# Patient Record
Sex: Female | Born: 1990 | Race: White | Hispanic: No | Marital: Single | Smoking: Never smoker
Health system: Southern US, Community
[De-identification: ages and names within clinical notes are randomized; demographics above are authoritative.]

## PROBLEM LIST (undated history)

## (undated) DIAGNOSIS — Z8639 Personal history of other endocrine, nutritional and metabolic disease: Secondary | ICD-10-CM

## (undated) HISTORY — PX: HAND SURGERY: SHX662

## (undated) HISTORY — DX: Personal history of other endocrine, nutritional and metabolic disease: Z86.39

---

## 2014-06-11 HISTORY — PX: SEPTOPLASTY: SHX2393

## 2015-08-05 MED FILL — DROSPIR-ETH ESTRA 3/.02 MG: 3-0.02 | 28 days supply | Qty: 28 | Fill #0

## 2015-08-11 ENCOUNTER — Telehealth: Payer: Self-pay | Admitting: Behavioral Health

## 2015-08-11 NOTE — Telephone Encounter (Signed)
Attempted to reach patient for Pre-Visit Call. Unable to leave a message at this time; the patient's voice mailbox is full.

## 2015-08-12 ENCOUNTER — Ambulatory Visit (INDEPENDENT_AMBULATORY_CARE_PROVIDER_SITE_OTHER): Payer: 59 | Admitting: Family

## 2015-08-12 ENCOUNTER — Encounter: Payer: Self-pay | Admitting: Family

## 2015-08-12 ENCOUNTER — Other Ambulatory Visit (HOSPITAL_COMMUNITY)
Admission: RE | Admit: 2015-08-12 | Discharge: 2015-08-12 | Disposition: A | Discharge status: Home / Self Care | Payer: 59 | Source: Ambulatory Visit | Attending: Family | Admitting: Family

## 2015-08-12 VITALS — BP 136/85 | HR 96 | Temp 97.9°F | Resp 16 | Ht 68.0 in | Wt 125.4 lb

## 2015-08-12 DIAGNOSIS — G4700 Insomnia, unspecified: Secondary | ICD-10-CM | POA: Diagnosis not present

## 2015-08-12 DIAGNOSIS — Z0000 Encounter for general adult medical examination without abnormal findings: Secondary | ICD-10-CM | POA: Diagnosis not present

## 2015-08-12 DIAGNOSIS — Z01419 Encounter for gynecological examination (general) (routine) without abnormal findings: Secondary | ICD-10-CM | POA: Diagnosis not present

## 2015-08-12 LAB — URINALYSIS, ROUTINE W REFLEX MICROSCOPIC
BILIRUBIN URINE: NEGATIVE
HGB URINE DIPSTICK: NEGATIVE
Ketones, ur: NEGATIVE
LEUKOCYTES UA: NEGATIVE
NITRITE: NEGATIVE
Specific Gravity, Urine: 1.02 (ref 1.000–1.030)
TOTAL PROTEIN, URINE-UPE24: NEGATIVE
URINE GLUCOSE: NEGATIVE
Urobilinogen, UA: 0.2 (ref 0.0–1.0)
pH: 6.5 (ref 5.0–8.0)

## 2015-08-12 LAB — HEPATIC FUNCTION PANEL
ALK PHOS: 35 U/L (ref 33–115)
ALT: 13 U/L (ref 6–29)
AST: 17 U/L (ref 10–30)
Albumin: 4.6 g/dL (ref 3.6–5.1)
BILIRUBIN INDIRECT: 0.5 mg/dL (ref 0.2–1.2)
Bilirubin, Direct: 0.1 mg/dL (ref <=0.2)
TOTAL PROTEIN: 7.4 g/dL (ref 6.1–8.1)
Total Bilirubin: 0.6 mg/dL (ref 0.2–1.2)

## 2015-08-12 LAB — BASIC METABOLIC PANEL
BUN: 12 mg/dL (ref 7–25)
CHLORIDE: 100 mmol/L (ref 98–110)
CO2: 27 mmol/L (ref 20–31)
Calcium: 9.7 mg/dL (ref 8.6–10.2)
Creat: 0.73 mg/dL (ref 0.50–1.10)
GLUCOSE: 84 mg/dL (ref 65–99)
POTASSIUM: 4.2 mmol/L (ref 3.5–5.3)
Sodium: 137 mmol/L (ref 135–146)

## 2015-08-12 LAB — TSH: TSH: 2.73 m[IU]/L

## 2015-08-12 LAB — LIPID PANEL
CHOL/HDL RATIO: 2.5 ratio (ref <=5.0)
CHOLESTEROL: 155 mg/dL (ref 125–200)
HDL: 63 mg/dL (ref >=46)
LDL Cholesterol: 74 mg/dL (ref <130)
TRIGLYCERIDES: 89 mg/dL (ref <150)
VLDL: 18 mg/dL (ref <30)

## 2015-08-12 LAB — HIV ANTIBODY (ROUTINE TESTING W REFLEX): HIV 1&2 Ab, 4th Generation: NONREACTIVE

## 2015-08-12 MED ORDER — ZOLPIDEM TARTRATE 5 MG PO TABS: 5.0000 mg | ORAL_TABLET | Freq: Every evening | ORAL | Status: DC | PRN

## 2015-08-12 MED ORDER — DROSPIRENONE-ETHINYL ESTRADIOL 3-0.02 MG PO TABS: 1.0000 | ORAL_TABLET | Freq: Every day | ORAL | Status: DC

## 2015-08-12 NOTE — Patient Instructions (Signed)
Please complete lab work prior to leaving. Keep up the great work with healthy diet and exercise. Welcome to [facility]!
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
## 2015-08-12 NOTE — Assessment & Plan Note (Signed)
Stable with prn use of ambien.  A controlled substance contract is signed today and will send for UDS.

## 2015-08-12 NOTE — Addendum Note (Signed)
Addended by: Mervin KungFERGERSON, Hailea Eaglin A on: 08/12/2015 10:21 AM   Modules accepted: Orders

## 2015-08-12 NOTE — Assessment & Plan Note (Signed)
Immunizations reviewed and up to date. Continue healthy diet, exercise, obtain routine lab work. Pap performed.

## 2015-08-12 NOTE — Addendum Note (Signed)
Addended by: Eustace QuailEABOLD, Tameka Hoiland J on: 08/12/2015 10:10 AM   Modules accepted: Orders

## 2015-08-12 NOTE — Progress Notes (Signed)
Pre visit review using our clinic review tool, if applicable. No additional management support is needed unless otherwise documented below in the visit note. 

## 2015-08-15 ENCOUNTER — Encounter: Payer: Self-pay | Admitting: Family

## 2015-08-16 LAB — CYTOLOGY - PAP

## 2015-08-17 NOTE — Progress Notes (Signed)
Ok

## 2015-09-05 MED FILL — ZOLPIDEM TARTRATE 5 MG TAB: 5 | 30 days supply | Qty: 30 | Fill #0

## 2015-09-05 MED FILL — DROSPIR-ETH ESTRA 3/.02 MG: 3-0.02 | 28 days supply | Qty: 28 | Fill #0

## 2015-09-26 ENCOUNTER — Emergency Department (HOSPITAL_COMMUNITY): Payer: 59

## 2015-09-26 ENCOUNTER — Emergency Department (HOSPITAL_COMMUNITY)
Admission: EM | Admit: 2015-09-26 | Discharge: 2015-09-26 | Disposition: A | Discharge status: Home / Self Care | Payer: 59 | Attending: Emergency Medicine | Admitting: Emergency Medicine

## 2015-09-26 ENCOUNTER — Encounter (HOSPITAL_COMMUNITY): Payer: Self-pay | Admitting: Emergency Medicine

## 2015-09-26 DIAGNOSIS — R1031 Right lower quadrant pain: Secondary | ICD-10-CM | POA: Diagnosis not present

## 2015-09-26 DIAGNOSIS — Z3202 Encounter for pregnancy test, result negative: Secondary | ICD-10-CM | POA: Diagnosis not present

## 2015-09-26 DIAGNOSIS — Z793 Long term (current) use of hormonal contraceptives: Secondary | ICD-10-CM | POA: Insufficient documentation

## 2015-09-26 DIAGNOSIS — R000 Tachycardia, unspecified: Secondary | ICD-10-CM | POA: Insufficient documentation

## 2015-09-26 DIAGNOSIS — D72829 Elevated white blood cell count, unspecified: Secondary | ICD-10-CM | POA: Insufficient documentation

## 2015-09-26 DIAGNOSIS — Z8639 Personal history of other endocrine, nutritional and metabolic disease: Secondary | ICD-10-CM | POA: Insufficient documentation

## 2015-09-26 DIAGNOSIS — R110 Nausea: Secondary | ICD-10-CM | POA: Diagnosis not present

## 2015-09-26 DIAGNOSIS — R509 Fever, unspecified: Secondary | ICD-10-CM | POA: Insufficient documentation

## 2015-09-26 DIAGNOSIS — R102 Pelvic and perineal pain: Secondary | ICD-10-CM

## 2015-09-26 LAB — COMPREHENSIVE METABOLIC PANEL
ALK PHOS: 39 U/L (ref 38–126)
ALT: 13 U/L — ABNORMAL LOW (ref 14–54)
ANION GAP: 10 (ref 5–15)
AST: 16 U/L (ref 15–41)
Albumin: 4.1 g/dL (ref 3.5–5.0)
BILIRUBIN TOTAL: 1.2 mg/dL (ref 0.3–1.2)
BUN: 10 mg/dL (ref 6–20)
CALCIUM: 9.4 mg/dL (ref 8.9–10.3)
CO2: 24 mmol/L (ref 22–32)
Chloride: 102 mmol/L (ref 101–111)
Creatinine, Ser: 0.74 mg/dL (ref 0.44–1.00)
GFR calc non Af Amer: >60 mL/min (ref >60)
Glucose, Bld: 118 mg/dL — ABNORMAL HIGH (ref 65–99)
Potassium: 3.6 mmol/L (ref 3.5–5.1)
SODIUM: 136 mmol/L (ref 135–145)
TOTAL PROTEIN: 7.4 g/dL (ref 6.5–8.1)

## 2015-09-26 LAB — URINALYSIS, ROUTINE W REFLEX MICROSCOPIC
BILIRUBIN URINE: NEGATIVE
Glucose, UA: NEGATIVE mg/dL
HGB URINE DIPSTICK: NEGATIVE
KETONES UR: 15 mg/dL — AB
Leukocytes, UA: NEGATIVE
Nitrite: NEGATIVE
PROTEIN: NEGATIVE mg/dL
Specific Gravity, Urine: 1.022 (ref 1.005–1.030)
pH: 6 (ref 5.0–8.0)

## 2015-09-26 LAB — CBC WITH DIFFERENTIAL/PLATELET
BASOS ABS: 0 10*3/uL (ref 0.0–0.1)
Basophils Relative: 0 %
Eosinophils Absolute: 0 10*3/uL (ref 0.0–0.7)
Eosinophils Relative: 0 %
HEMATOCRIT: 40.8 % (ref 36.0–46.0)
Hemoglobin: 13.8 g/dL (ref 12.0–15.0)
LYMPHS PCT: 13 %
Lymphs Abs: 1.9 10*3/uL (ref 0.7–4.0)
MCH: 29.9 pg (ref 26.0–34.0)
MCHC: 33.8 g/dL (ref 30.0–36.0)
MCV: 88.5 fL (ref 78.0–100.0)
MONO ABS: 0.9 10*3/uL (ref 0.1–1.0)
MONOS PCT: 7 %
NEUTROS ABS: 11.3 10*3/uL — AB (ref 1.7–7.7)
Neutrophils Relative %: 80 %
Platelets: 312 10*3/uL (ref 150–400)
RBC: 4.61 MIL/uL (ref 3.87–5.11)
RDW: 13 % (ref 11.5–15.5)
WBC: 14.2 10*3/uL — ABNORMAL HIGH (ref 4.0–10.5)

## 2015-09-26 LAB — I-STAT BETA HCG BLOOD, ED (MC, WL, AP ONLY)

## 2015-09-26 MED ORDER — HYDROCODONE-ACETAMINOPHEN 5-325 MG PO TABS: 1.0000 | ORAL_TABLET | Freq: Four times a day (QID) | ORAL | Status: DC | PRN

## 2015-09-26 MED ORDER — SODIUM CHLORIDE 0.9 % IV BOLUS (SEPSIS)
1000.0000 mL | Freq: Once | INTRAVENOUS | Status: AC
  Administered 2015-09-26: 1000 mL via INTRAVENOUS

## 2015-09-26 MED ORDER — ONDANSETRON 4 MG PO TBDP: 4.0000 mg | ORAL_TABLET | Freq: Three times a day (TID) | ORAL | Status: DC | PRN

## 2015-09-26 MED ORDER — MORPHINE SULFATE (PF) 2 MG/ML IV SOLN
2.0000 mg | Freq: Once | INTRAVENOUS | Status: AC
  Administered 2015-09-26: 2 mg via INTRAVENOUS
  Filled 2015-09-26: qty 1

## 2015-09-26 MED ORDER — ACETAMINOPHEN 500 MG PO TABS
1000.0000 mg | ORAL_TABLET | Freq: Once | ORAL | Status: AC
  Administered 2015-09-26: 1000 mg via ORAL
  Filled 2015-09-26: qty 2

## 2015-09-26 MED ORDER — ONDANSETRON HCL 4 MG/2ML IJ SOLN
4.0000 mg | Freq: Once | INTRAMUSCULAR | Status: AC
  Administered 2015-09-26: 4 mg via INTRAVENOUS
  Filled 2015-09-26: qty 2

## 2015-09-26 MED ORDER — IOPAMIDOL (ISOVUE-300) INJECTION 61%
INTRAVENOUS | Status: AC
  Administered 2015-09-26: 100 mL
  Filled 2015-09-26: qty 100

## 2015-09-26 NOTE — ED Notes (Signed)
Pt ambulates independently and with steady gait at time of discharge. Discharge instructions and follow up information reviewed with patient. No other questions or concerns voiced at this time.  

## 2015-09-26 NOTE — ED Provider Notes (Signed)
CSN: [id number]     Arrival date & time 09/26/15  1315 History  By signing my name below, I, Ruth Stone, attest that this documentation has been prepared under the direction and in the presence of Kristie CowmanHeather Garv Kuechle PA-C,   Electronically Signed: [name], ED Scribe 09/26/2015 at 6:20 PM.  Chief Complaint  Patient presents with  . Abdominal Pain   The history is provided by the patient. No language interpreter was used.   HPI Comments: De HollingsheadCatherine L Stone is a 25 y.o. female who presents to the Emergency Department complaining of gradual onset, worsening, RLQ pain, ongoing for two days. Symptoms began as generalized abdominal pain before localizing to the RLQ. Pt reports associated fever with tmax of 101 degrees, and nausea. No other associated symptoms noted. Zofran used at home with relief to nausea. Pt took tylenol with some relief to fever and abdominal pain. No other worsening or alleviating factors noted. Pt denies diarrhea, constipation, vomiting, vaginal discharge, vaginal bleeding, or any other pertinent symptoms. LMP was 09/04/2015.   Past Medical History  Diagnosis Date  . History of hypothyroidism    Past Surgical History  Procedure Laterality Date  . Hand surgery Right 2010-2011    tendon transfers and skin grafts  . Septoplasty  2016    sinus ballooning and turbinate reduction   Family History  Problem Relation Age of Onset  . Cancer Mother     history of breast and skin cancer  . Hyperlipidemia Father   . Hypertension Father   . Heart attack Maternal Grandfather   . Stroke Paternal Grandfather    Social History  Substance Use Topics  . Smoking status: Never Smoker   . Smokeless tobacco: Never Used  . Alcohol Use: 2.4 - 3.0 oz/week    4-5 Standard drinks or equivalent per week   OB History    No data available     Review of Systems A complete 10 system review of systems was obtained and all systems are negative except as noted in the HPI and PMH.     Allergies  Review of patient's allergies indicates no known allergies.  Home Medications   Prior to Admission medications   Medication Sig Start Date End Date Taking? Authorizing Provider  drospirenone-ethinyl estradiol (YAZ,GIANVI,LORYNA) 3-0.02 MG tablet Take 1 tablet by mouth daily. 08/12/15   Sandford CrazeMelissa O'Sullivan, NP  zolpidem (AMBIEN) 5 MG tablet Take 1 tablet (5 mg total) by mouth at bedtime as needed for sleep. 08/12/15   Sandford CrazeMelissa O'Sullivan, NP   BP 127/86 mmHg  Pulse 119  Temp(Src) 98.8 F (37.1 C) (Oral)  Resp 20  Ht 5\' 7"  (1.702 m)  Wt 125 lb (56.7 kg)  BMI 19.57 kg/m2  SpO2 100% Physical Exam  Constitutional: She appears well-developed and well-nourished. No distress.  HENT:  Head: Normocephalic and atraumatic.  Mouth/Throat: Oropharynx is clear and moist.  Eyes: Conjunctivae and EOM are normal.  Neck: Normal range of motion. Neck supple. No tracheal deviation present.  Cardiovascular: Regular rhythm.  Tachycardia present.  Exam reveals no gallop.   No murmur heard. Pulmonary/Chest: Effort normal and breath sounds normal. No respiratory distress.  Abdominal: Soft. Bowel sounds are normal. She exhibits no distension and no mass. There is tenderness. There is tenderness at McBurney's point. There is no rebound and no guarding.  RLQ TTP. Positive Rovsing's sign.   Genitourinary: Vagina normal and uterus normal. Cervix exhibits no motion tenderness and no discharge. Right adnexum displays no mass, no tenderness and no  fullness. Left adnexum displays no mass, no tenderness and no fullness. No erythema or tenderness in the vagina. No vaginal discharge found.  Musculoskeletal: Normal range of motion.  Neurological: She is alert.  Skin: Skin is warm and dry.  Psychiatric: She has a normal mood and affect. Her behavior is normal.  Nursing note and vitals reviewed.  Chaperone present for pelvic exam.   ED Course  Procedures (including critical care time) DIAGNOSTIC  STUDIES: Oxygen Saturation is 100% on RA, normal by my interpretation.    COORDINATION OF CARE: 1:55 PM-Discussed treatment plan which includes pelvic exam, US pelvic complete, CT abdomen pelvis with contrast, CBC with differential/platelet, urinalysis, and CMP with pt at bedside and pt agreed to plan.   Labs Review Labs Reviewed  CBC WITH DIFFERENTIAL/PLATELET - Abnormal; Notable for the following:    WBC 14.2 (*)    Neutro Abs 11.3 (*)    All other components within normal limits  COMPREHENSIVE METABOLIC PANEL - Abnormal; Notable for the following:    Glucose, Bld 118 (*)    ALT 13 (*)    All other components within normal limits  URINALYSIS, ROUTINE W REFLEX MICROSCOPIC (NOT AT [facility]) - Abnormal; Notable for the following:    Ketones, ur 15 (*)    All other components within normal limits  I-STAT BETA HCG BLOOD, ED ([facility], [facility], [facility] ONLY)    Imaging Review US Transvaginal Non-ob  09/26/2015  CLINICAL DATA:  Right lower quadrant abdominal pain for 2 days. EXAM: TRANSABDOMINAL AND TRANSVAGINAL ULTRASOUND OF PELVIS TECHNIQUE: Both transabdominal and transvaginal ultrasound examinations of the pelvis were performed. Transabdominal technique was performed for global imaging of the pelvis including uterus, ovaries, adnexal regions, and pelvic cul-de-sac. It was necessary to proceed with endovaginal exam following the transabdominal exam to visualize the endometrium and ovaries. COMPARISON:  09/20/2015 FINDINGS: Uterus Measurements: 6.8 by 2.7 by 3.9 cm. No fibroids or other mass visualized. Endometrium Thickness: 1 mm.  No focal abnormality visualized. Right ovary Measurements: 1.9 by 1.7 by 2.8 cm (volume 4.7 cc). Normal appearance/no adnexal mass. Left ovary Measurements: 1.9 by 0.8 by 2.2 cm (volume 1.7 cc). Normal appearance/no adnexal mass. Other findings Trace free pelvic fluid, image 88, likely physiologic. IMPRESSION: 1. No acute abnormality involving the uterus or ovaries is identified. 2.  Smaller than average ovaries for age. Electronically Signed   By: Gaylyn Rong M.D.   On: 09/26/2015 18:13   US Pelvis Complete  09/26/2015  CLINICAL DATA:  Right lower quadrant abdominal pain for 2 days. EXAM: TRANSABDOMINAL AND TRANSVAGINAL ULTRASOUND OF PELVIS TECHNIQUE: Both transabdominal and transvaginal ultrasound examinations of the pelvis were performed. Transabdominal technique was performed for global imaging of the pelvis including uterus, ovaries, adnexal regions, and pelvic cul-de-sac. It was necessary to proceed with endovaginal exam following the transabdominal exam to visualize the endometrium and ovaries. COMPARISON:  09/20/2015 FINDINGS: Uterus Measurements: 6.8 by 2.7 by 3.9 cm. No fibroids or other mass visualized. Endometrium Thickness: 1 mm.  No focal abnormality visualized. Right ovary Measurements: 1.9 by 1.7 by 2.8 cm (volume 4.7 cc). Normal appearance/no adnexal mass. Left ovary Measurements: 1.9 by 0.8 by 2.2 cm (volume 1.7 cc). Normal appearance/no adnexal mass. Other findings Trace free pelvic fluid, image 88, likely physiologic. IMPRESSION: 1. No acute abnormality involving the uterus or ovaries is identified. 2. Smaller than average ovaries for age. Electronically Signed   By: Gaylyn Rong M.D.   On: 09/26/2015 18:13   Ct Abdomen Pelvis W Contrast  09/26/2015  CLINICAL DATA:  Right lower quadrant pain and nausea for 2 days. Initial encounter. EXAM: CT ABDOMEN AND PELVIS WITH CONTRAST TECHNIQUE: Multidetector CT imaging of the abdomen and pelvis was performed using the standard protocol following bolus administration of intravenous contrast. CONTRAST:  100 ml ISOVUE-300 IOPAMIDOL (ISOVUE-300) INJECTION 61% COMPARISON:  None. FINDINGS: The lung bases are clear.  No pleural or pericardial effusion. The gallbladder, liver, spleen, adrenal glands, pancreas and kidneys appear normal. Trace amount of free pelvic fluid is consistent with physiologic change. Uterus, adnexa  and urinary bladder appear normal. The stomach, small and large bowel and appendix are unremarkable. There is no lymphadenopathy. No focal bony abnormality is identified. IMPRESSION: Negative for appendicitis.  Negative CT abdomen and pelvis. Electronically Signed   By: Drusilla Kanner M.D.   On: 09/26/2015 16:21   I have personally reviewed and evaluated these images and lab results as part of my medical decision-making.   EKG Interpretation None      MDM   Final diagnoses:  RLQ abdominal pain  Patient presents today with RLQ abdominal pain that has been present for the past couple of days.  Labs unremarkable aside from mild leukocytosis.  UA negative.  Pregnancy test also negative.  CT ab/pelvis is also negative.  Pelvic ultrasound is negative.  Feel that the patient is stable for discharge.  Return precautions given.   I personally performed the services described in this documentation, which was scribed in my presence. The recorded information has been reviewed and is accurate.    Santiago Glad, PA-C 09/27/15 0015  Doug Sou, MD 09/28/15 [phone]

## 2015-09-26 NOTE — ED Notes (Signed)
Pt here with RLQ pain and fever since Saturday. Pt endorses nausea relieved by zofran. HR 120

## 2015-10-03 MED FILL — DROSPIR-ETH ESTRA 3/.02 MG: 3-0.02 | 84 days supply | Qty: 84 | Fill #1

## 2015-12-19 MED FILL — DROSPIR-ETH ESTRA 3/.02 MG: 3-0.02 | 56 days supply | Qty: 56 | Fill #1

## 2016-01-13 ENCOUNTER — Ambulatory Visit (INDEPENDENT_AMBULATORY_CARE_PROVIDER_SITE_OTHER): Payer: 59 | Admitting: Family Medicine

## 2016-01-13 ENCOUNTER — Encounter: Payer: Self-pay | Admitting: Family Medicine

## 2016-01-13 VITALS — BP 134/89 | HR 87 | Temp 98.9°F | Resp 20 | Wt 118.5 lb

## 2016-01-13 DIAGNOSIS — J029 Acute pharyngitis, unspecified: Secondary | ICD-10-CM

## 2016-01-13 LAB — POCT RAPID STREP A (OFFICE): Rapid Strep A Screen: NEGATIVE

## 2016-01-13 MED ORDER — AMOXICILLIN-POT CLAVULANATE 875-125 MG PO TABS: 1.0000 | ORAL_TABLET | Freq: Two times a day (BID) | ORAL | 0 refills | Status: DC

## 2016-01-13 NOTE — Patient Instructions (Signed)
Your strep is negative today. It could be early in the course of illness. If symptoms worsen over the weekend have printed script filled. Until then start Flonase, rest and hydrate.

## 2016-01-13 NOTE — Progress Notes (Signed)
AUGUSTINE BRANNICK , [birth year], 25 y.o., female MRN: [id number] Patient Care Team    Relationship Specialty Notifications Start End  Sandford Craze, NP PCP - General Internal Medicine  08/12/15     CC: sore throat Subjective: Pt presents for an acute OV with complaints of sore throat of 2 days duration.  Associated symptoms include ear pain, swollen glands and fever. She reports it hurts to swallow. She has taken tylenol and ibf for fever. Her niece and nephew was diagnosed with strep this weekend and she was around them. No lung disease history.     No Known Allergies Social History  Substance Use Topics  . Smoking status: Never Smoker  . Smokeless tobacco: Never Used  . Alcohol use 2.4 - 3.0 oz/week    4 - 5 Standard drinks or equivalent per week   Past Medical History:  Diagnosis Date  . History of hypothyroidism    Past Surgical History:  Procedure Laterality Date  . HAND SURGERY Right 2010-2011   tendon transfers and skin grafts  . SEPTOPLASTY  2016   sinus ballooning and turbinate reduction   Family History  Problem Relation Age of Onset  . Cancer Mother     history of breast and skin cancer  . Hyperlipidemia Father   . Hypertension Father   . Heart attack Maternal Grandfather   . Stroke Paternal Grandfather      Medication List       Accurate as of 01/13/16  2:43 PM. Always use your most recent med list.          drospirenone-ethinyl estradiol 3-0.02 MG tablet Commonly known as:  YAZ,GIANVI,LORYNA Take 1 tablet by mouth daily.   zolpidem 5 MG tablet Commonly known as:  AMBIEN Take 1 tablet (5 mg total) by mouth at bedtime as needed for sleep.       No results found for this or any previous visit (from the past 24 hour(s)). No results found.   ROS: Negative, with the exception of above mentioned in HPI   Objective:  BP 134/89 (BP Location: Right Arm, Patient Position: Sitting, Cuff Size: Normal)   Pulse 87   Temp 98.9 F (37.2 C)    Resp 20   Wt 118 lb 8 oz (53.8 kg)   LMP 12/24/2015 (Approximate)   SpO2 99%   BMI 18.56 kg/m  Body mass index is 18.56 kg/m. Gen: Afebrile. No acute distress. Nontoxic in appearance, well developed, well nourished.  HENT: AT. [place]. Bilateral TM visualized with bilateral fullness, no erythema. MMM, no oral lesions. Bilateral nares mild erythema,. Throat with erythema, no exudates. No cough present on exam. Mild hoarseness present on exam. No tenderness to facial sinuses. Eyes:Pupils Equal Round Reactive to light, Extraocular movements intact,  Conjunctiva without redness, discharge or icterus. Neck/lymp/endocrine: Supple, no lymphadenopathy CV: RRR  Chest: CTAB, no wheeze or crackles.  Abd: Soft.. NTND. BS present.  Skin: No rashes, purpura or petechiae.  Neuro: Normal gait. PERLA. EOMi. Alert. Oriented x3   Assessment/Plan: TEDDI BADALAMENTI is a 25 y.o. female present for acute OV for  1. Sore throat - POCT rapid strep A: Negative  2. Acute pharyngitis, unspecified etiology - Discussed with patient she does have some swollen glands, this is either an early strep pharyngitis or a viral pharyngitis. Considering her niece and nephew tested positive for strep, provided patient if he Augmentin printed prescription. She is to treat her symptoms for the next 2 days as file with supportive  therapy, if symptoms are worsening by Sunday she is to have Augmentin prescription filled and start. - Follow-up in 10 days if symptoms are not improving or resolved.  > 25 minutes spent with patient, >50% of time spent face to face counseling patient and coordinating care.   electronically signed by:  Felix Pacini, DO  [facility] - [place]

## 2016-01-24 ENCOUNTER — Encounter: Payer: Self-pay | Admitting: Family

## 2016-01-25 MED ORDER — ZOLPIDEM TARTRATE 5 MG PO TABS: 5.0000 mg | ORAL_TABLET | Freq: Every evening | ORAL | 0 refills | Status: DC | PRN

## 2016-01-25 NOTE — Telephone Encounter (Signed)
Last zolpidem Rx:  08/12/15 No previous UDS or CSC Last OV: 08/12/15 Next OV: none scheduled.   Rx printed and forwarded to PCP for signature. Please advise when pt should follow up in the office?

## 2016-01-25 NOTE — Telephone Encounter (Signed)
1 yr follow up for cpx please.

## 2016-02-07 MED ORDER — ZOLPIDEM TARTRATE 5 MG PO TABS: 5.0000 mg | ORAL_TABLET | Freq: Every evening | ORAL | 0 refills | Status: DC | PRN

## 2016-02-07 MED FILL — GIANVI 3-0.02 MG TABS: 3-0.02 | 28 days supply | Qty: 28 | Fill #2

## 2016-02-07 NOTE — Telephone Encounter (Signed)
Rx cancelled at [facility] and called to Darl Pikes[facility]an [facility]. Mychart message sent to pt.

## 2016-02-07 NOTE — Telephone Encounter (Signed)
Pt called in because her Rx for Ambien was sent to the incorrect pharmacy. Pt would like to have Rx sent to the [facility] instead.

## 2016-02-08 MED FILL — ZOLPIDEM TARTRATE 5 MG TAB: 5 | 30 days supply | Qty: 30 | Fill #0

## 2016-03-08 MED FILL — FLUCONAZOLE 150 MG TABLET: 150 | 2 days supply | Qty: 2 | Fill #0

## 2016-03-13 ENCOUNTER — Other Ambulatory Visit: Payer: Self-pay | Admitting: Emergency Medicine

## 2016-03-13 MED ORDER — DROSPIRENONE-ETHINYL ESTRADIOL 3-0.02 MG PO TABS: 1.0000 | ORAL_TABLET | Freq: Every day | ORAL | 3 refills | Status: DC

## 2016-03-13 MED FILL — GIANVI 3-0.02 MG TABS: 3-0.02 | 84 days supply | Qty: 84 | Fill #0

## 2016-03-26 DIAGNOSIS — H5213 Myopia, bilateral: Secondary | ICD-10-CM | POA: Diagnosis not present

## 2016-06-01 ENCOUNTER — Encounter: Payer: Self-pay | Admitting: Family

## 2016-06-01 MED ORDER — ZOLPIDEM TARTRATE 5 MG PO TABS: 5.0000 mg | ORAL_TABLET | Freq: Every evening | ORAL | 0 refills | Status: DC | PRN

## 2016-06-01 MED FILL — ZOLPIDEM TARTRATE 5 MG TAB: 5 | 30 days supply | Qty: 30 | Fill #0

## 2016-06-01 MED FILL — GIANVI 3-0.02 MG TABS: 3-0.02 | 28 days supply | Qty: 28 | Fill #1

## 2016-06-01 NOTE — Telephone Encounter (Signed)
Rx faxed, message sent to pt.

## 2016-06-01 NOTE — Telephone Encounter (Signed)
Last OV: 08/12/15 Next OV:  None scheduled UDS: Contract signed but no UDS given at 08/12/15 visit.  Will need pt to pick up rx and given UDS.   Rx printed and forwarded to PCP for signature.

## 2016-07-12 ENCOUNTER — Other Ambulatory Visit: Payer: Self-pay | Admitting: Family

## 2016-07-13 ENCOUNTER — Telehealth: Payer: Self-pay | Admitting: Family

## 2016-07-13 MED FILL — GIANVI 3-0.02 MG TABS: 3-0.02 | 28 days supply | Qty: 28 | Fill #0

## 2016-07-13 NOTE — Telephone Encounter (Signed)
Patient is requesting a refill of drospirenone-ethinyl estradiol (YAZ,GIANVI,LORYNA) 3-0.02 MG tablet Please advise  Pharmacy: [facility] [place] [address]

## 2016-07-13 NOTE — Telephone Encounter (Signed)
Rx already sent.

## 2016-08-06 ENCOUNTER — Encounter: Payer: 59 | Admitting: Family

## 2016-08-07 ENCOUNTER — Other Ambulatory Visit: Payer: Self-pay | Admitting: Family

## 2016-08-07 MED FILL — TIVICAY 50 MG TABLET: 50 | 28 days supply | Qty: 28 | Fill #0

## 2016-08-07 MED FILL — DESCOVY 200-25 MG TABS: 200-25 | 28 days supply | Qty: 28 | Fill #0

## 2016-08-08 ENCOUNTER — Other Ambulatory Visit: Payer: Self-pay | Admitting: Family Medicine

## 2016-08-08 MED ORDER — ONDANSETRON HCL 4 MG PO TABS: 4.0000 mg | ORAL_TABLET | Freq: Three times a day (TID) | ORAL | 1 refills | Status: DC | PRN

## 2016-08-08 MED ORDER — DIAZEPAM 5 MG PO TABS: 5.0000 mg | ORAL_TABLET | Freq: Three times a day (TID) | ORAL | 1 refills | Status: DC | PRN

## 2016-08-08 MED FILL — GIANVI 3-0.02 MG TABS: 3-0.02 | 28 days supply | Qty: 28 | Fill #0

## 2016-08-08 MED FILL — ONDANSETRON ODT 4 MG TABLET: 4 | 10 days supply | Qty: 30 | Fill #0

## 2016-08-08 MED FILL — diazePAM 5 MG TABS: 5 | 10 days supply | Qty: 30 | Fill #0

## 2016-08-21 ENCOUNTER — Encounter: Payer: Self-pay | Admitting: Family

## 2016-08-21 ENCOUNTER — Ambulatory Visit (INDEPENDENT_AMBULATORY_CARE_PROVIDER_SITE_OTHER): Payer: 59 | Admitting: Family

## 2016-08-21 VITALS — BP 139/100 | HR 69 | Temp 98.7°F | Resp 17 | Ht 68.0 in | Wt 129.9 lb

## 2016-08-21 DIAGNOSIS — Z79891 Long term (current) use of opiate analgesic: Secondary | ICD-10-CM | POA: Diagnosis not present

## 2016-08-21 DIAGNOSIS — Z0000 Encounter for general adult medical examination without abnormal findings: Secondary | ICD-10-CM | POA: Diagnosis not present

## 2016-08-21 LAB — LIPID PANEL
CHOL/HDL RATIO: 3
CHOLESTEROL: 151 mg/dL (ref 0–200)
HDL: 57.6 mg/dL (ref >39.00)
LDL Cholesterol: 74 mg/dL (ref 0–99)
NonHDL: 93.56
TRIGLYCERIDES: 96 mg/dL (ref 0.0–149.0)
VLDL: 19.2 mg/dL (ref 0.0–40.0)

## 2016-08-21 LAB — CBC WITH DIFFERENTIAL/PLATELET
BASOS PCT: 0.7 % (ref 0.0–3.0)
Basophils Absolute: 0 10*3/uL (ref 0.0–0.1)
EOS ABS: 0.1 10*3/uL (ref 0.0–0.7)
Eosinophils Relative: 1.6 % (ref 0.0–5.0)
HEMATOCRIT: 41 % (ref 36.0–46.0)
Hemoglobin: 13.6 g/dL (ref 12.0–15.0)
Lymphocytes Relative: 26.9 % (ref 12.0–46.0)
Lymphs Abs: 1.5 10*3/uL (ref 0.7–4.0)
MCHC: 33.2 g/dL (ref 30.0–36.0)
MCV: 89 fl (ref 78.0–100.0)
MONO ABS: 0.6 10*3/uL (ref 0.1–1.0)
Monocytes Relative: 10.1 % (ref 3.0–12.0)
NEUTROS ABS: 3.3 10*3/uL (ref 1.4–7.7)
Neutrophils Relative %: 60.7 % (ref 43.0–77.0)
PLATELETS: 349 10*3/uL (ref 150.0–400.0)
RBC: 4.61 Mil/uL (ref 3.87–5.11)
RDW: 13.3 % (ref 11.5–15.5)
WBC: 5.5 10*3/uL (ref 4.0–10.5)

## 2016-08-21 LAB — BASIC METABOLIC PANEL
BUN: 11 mg/dL (ref 6–23)
CHLORIDE: 101 meq/L (ref 96–112)
CO2: 28 meq/L (ref 19–32)
CREATININE: 0.76 mg/dL (ref 0.40–1.20)
Calcium: 9.8 mg/dL (ref 8.4–10.5)
GFR: 96.53 mL/min (ref >60.00)
GLUCOSE: 89 mg/dL (ref 70–99)
Potassium: 3.9 mEq/L (ref 3.5–5.1)
Sodium: 136 mEq/L (ref 135–145)

## 2016-08-21 LAB — HEPATIC FUNCTION PANEL
ALK PHOS: 32 U/L — AB (ref 39–117)
ALT: 17 U/L (ref 0–35)
AST: 19 U/L (ref 0–37)
Albumin: 4.3 g/dL (ref 3.5–5.2)
BILIRUBIN DIRECT: 0.1 mg/dL (ref 0.0–0.3)
TOTAL PROTEIN: 7.4 g/dL (ref 6.0–8.3)
Total Bilirubin: 0.4 mg/dL (ref 0.2–1.2)

## 2016-08-21 LAB — TSH: TSH: 4.88 u[IU]/mL — ABNORMAL HIGH (ref 0.35–4.50)

## 2016-08-21 MED ORDER — DROSPIRENONE-ETHINYL ESTRADIOL 3-0.02 MG PO TABS: 1.0000 | ORAL_TABLET | Freq: Every day | ORAL | 4 refills | Status: DC

## 2016-08-21 NOTE — Progress Notes (Signed)
Pre visit review using our clinic review tool, if applicable. No additional management support is needed unless otherwise documented below in the visit note. 

## 2016-08-21 NOTE — Patient Instructions (Addendum)
Please complete lab work prior to leaving.  Continue healthy diet, exercise and limit your sodium.

## 2016-08-21 NOTE — Progress Notes (Signed)
**Note Ruth-Identified via Obfuscation** Subjective:    Patient ID: Ruth Stone, female    DOB: April 11, 1991, 26 y.o.   MRN: [id number]  HPI  Patient presents today for complete physical.  Immunizations: up to date Diet: healthy Exercise: 5-7 day a week, pure bar, weight lifting, and cardio Pap Smear: 3/17 Dental: up to date Vision: 10/17 up to date  Wt Readings from Last 3 Encounters:  08/21/16 129 lb 14.4 oz (58.9 kg)  01/13/16 118 lb 8 oz (53.8 kg)  09/26/15 125 lb (56.7 kg)     Review of Systems  Constitutional: Negative for unexpected weight change.  HENT: Negative for hearing loss and rhinorrhea.   Eyes: Negative for visual disturbance.  Respiratory: Negative for cough and shortness of breath.   Cardiovascular: Negative for chest pain and leg swelling.  Gastrointestinal: Negative for constipation and diarrhea.  Genitourinary: Negative for dysuria, frequency and menstrual problem.  Musculoskeletal: Negative for arthralgias and myalgias.  Hematological: Negative for adenopathy.  Psychiatric/Behavioral:       Denies depression/anxiety   Past Medical History:  Diagnosis Date  . History of hypothyroidism      Social History   Social History  . Marital status: Married    Spouse name: N/A  . Number of children: N/A  . Years of education: N/A   Occupational History  . Not on file.   Social History Main Topics  . Smoking status: Never Smoker  . Smokeless tobacco: Never Used  . Alcohol use 2.4 - 3.0 oz/week    4 - 5 Standard drinks or equivalent per week  . Drug use: No  . Sexual activity: Not on file   Other Topics Concern  . 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     Past Surgical History:  Procedure Laterality Date  . HAND SURGERY Right 2010-2011   tendon transfers and skin grafts  . SEPTOPLASTY  2016   sinus ballooning and turbinate reduction     Family History  Problem Relation Age of Onset  . Cancer Mother     history of breast and skin cancer  . Hyperlipidemia Father   . Hypertension Father   . Heart attack Maternal Grandfather   . Stroke Paternal Grandfather     No Known Allergies  Current Outpatient Prescriptions on File Prior to Visit  Medication Sig Dispense Refill  . diazepam (VALIUM) 5 MG tablet Take 1 tablet (5 mg total) by mouth every 8 (eight) hours as needed for anxiety. 30 tablet 1  . GIANVI 3-0.02 MG tablet TAKE 1 TABLET BY MOUTH DAILY. 28 tablet 1  . ondansetron (ZOFRAN) 4 MG tablet Take 1 tablet (4 mg total) by mouth every 8 (eight) hours as needed for nausea or vomiting. 30 tablet 1  . zolpidem (AMBIEN) 5 MG tablet Take 1 tablet (5 mg total) by mouth at bedtime as needed for sleep. 30 tablet 0   No current facility-administered medications on file prior to visit.     BP (!) 139/100 (BP Location: Left Arm, Patient Position: Sitting, Cuff Size: Normal)   Pulse 69   Temp 98.7 F (37.1 C) (Oral)   Resp 17   Ht 5\' 8"  (1.727 m)   Wt 129 lb 14.4 oz (58.9 kg)   LMP 08/11/2016   SpO2 100%   BMI 19.75 kg/m  Objective:   Physical Exam Physical Exam  Constitutional: She is oriented to person, place, and time. She appears well-developed and well-nourished. No distress.  HENT:  Head: Normocephalic and atraumatic.  Right Ear: Tympanic membrane and ear canal normal.  Left Ear: Tympanic membrane and ear canal normal.  Mouth/Throat: Oropharynx is clear and moist.  Eyes: Pupils are equal, round, and reactive to light. No scleral icterus.  Neck: Normal range of motion. No thyromegaly present.  Cardiovascular: Normal rate and regular rhythm.   No murmur heard. Pulmonary/Chest: Effort normal and breath sounds normal. No respiratory distress. He has no wheezes. She has no rales. She exhibits no tenderness.  Abdominal: Soft. Bowel sounds are normal. She exhibits no distension and no mass. There is no  tenderness. There is no rebound and no guarding.  Musculoskeletal: She exhibits no edema.  Lymphadenopathy:    She has no cervical adenopathy.  Neurological: She is alert and oriented to person, place, and time. She has normal patellar reflexes. She exhibits normal muscle tone. Coordination normal.  Skin: Skin is warm and dry.  Psychiatric: She has a normal mood and affect. Her behavior is normal. Judgment and thought content normal.  Breast/pelvic: deferred           Assessment & Plan:          Assessment & Plan:  Preventative care- immunizations reviewed and up to date. Discussed continuing healthy diet and exercise. BP is elevated today. Will have her follow up in 2 weeks for a nurse visit BP check.

## 2016-08-23 ENCOUNTER — Other Ambulatory Visit (INDEPENDENT_AMBULATORY_CARE_PROVIDER_SITE_OTHER): Payer: 59

## 2016-08-23 ENCOUNTER — Telehealth: Payer: Self-pay

## 2016-08-23 DIAGNOSIS — I43 Cardiomyopathy in diseases classified elsewhere: Secondary | ICD-10-CM

## 2016-08-23 DIAGNOSIS — E039 Hypothyroidism, unspecified: Secondary | ICD-10-CM | POA: Diagnosis not present

## 2016-08-23 DIAGNOSIS — I519 Heart disease, unspecified: Principal | ICD-10-CM

## 2016-08-23 LAB — T3, FREE: T3, Free: 3.3 pg/mL (ref 2.3–4.2)

## 2016-08-23 LAB — T4, FREE: Free T4: 0.72 ng/dL (ref 0.60–1.60)

## 2016-08-23 NOTE — Telephone Encounter (Signed)
-----   Message from Sandford CrazeMelissa O'Sullivan, NP sent at 08/22/2016  5:42 PM EDT ----- Could you please ask lab to add on free t3 and free t4 dx hypothyroid.

## 2016-08-23 NOTE — Telephone Encounter (Signed)
Spoke wit Onalee Huaavid in lab and he sent in the add on.

## 2016-08-26 ENCOUNTER — Telehealth: Payer: Self-pay | Admitting: Family

## 2016-08-26 DIAGNOSIS — Z0000 Encounter for general adult medical examination without abnormal findings: Secondary | ICD-10-CM

## 2016-08-26 DIAGNOSIS — E039 Hypothyroidism, unspecified: Secondary | ICD-10-CM

## 2016-08-26 NOTE — Telephone Encounter (Signed)
See mychart.  

## 2016-08-27 LAB — VITAMIN D 1,25 DIHYDROXY
VITAMIN D 1, 25 (OH) TOTAL: 100 pg/mL — AB (ref 18–72)
Vitamin D2 1, 25 (OH)2: <8 pg/mL
Vitamin D3 1, 25 (OH)2: 100 pg/mL

## 2016-08-28 ENCOUNTER — Telehealth: Payer: Self-pay | Admitting: Family

## 2016-08-28 NOTE — Telephone Encounter (Signed)
Vitamin D is elevated.  Is she taking a vit D supplement?  If so she should discontinue.

## 2016-08-29 NOTE — Addendum Note (Signed)
Addended by: Harley AltoPRICE, KRISTY M on: 08/29/2016 09:57 AM   Modules accepted: Orders

## 2016-08-29 NOTE — Telephone Encounter (Signed)
Spoke with pt about results pt voiced understanding and she is not taking Vit D Supplement.

## 2016-09-04 MED FILL — CEPHALEXIN 500 MG CAPSULE: 500 | 7 days supply | Qty: 14 | Fill #0

## 2016-09-10 ENCOUNTER — Encounter: Payer: 59 | Admitting: Family

## 2016-09-10 MED FILL — DROSPIR-ETH ESTRA 3/.02 MG: 3-0.02 | 84 days supply | Qty: 84 | Fill #0

## 2016-10-31 ENCOUNTER — Ambulatory Visit: Payer: 59 | Admitting: Internal Medicine

## 2016-11-13 ENCOUNTER — Telehealth: Payer: Self-pay | Admitting: *Deleted

## 2016-11-13 MED FILL — ONDANSETRON ODT 4 MG TABLET: 4 | 10 days supply | Qty: 30 | Fill #1

## 2016-11-13 NOTE — Telephone Encounter (Signed)
Received fax from [facility] requesting rx for 3 month supply. Faxed response to see Rx on file from 08/21/16, # 3 packs x 4 refills. Upon review of chart, it looks like pt never returned for follow up BP check or additional labs. Scheduled pt follow up with PCP for 11/19/16 at 7am. Will do labs at that visit as well. Sent mychart message to pt.

## 2016-11-19 ENCOUNTER — Ambulatory Visit: Payer: 59 | Admitting: Family

## 2016-12-05 MED FILL — LORYNA 3-0.02 MG TAB: 3-0.02 | 84 days supply | Qty: 84 | Fill #1

## 2017-01-22 DIAGNOSIS — Z01419 Encounter for gynecological examination (general) (routine) without abnormal findings: Secondary | ICD-10-CM | POA: Diagnosis not present

## 2017-01-22 DIAGNOSIS — R87612 Low grade squamous intraepithelial lesion on cytologic smear of cervix (LGSIL): Secondary | ICD-10-CM | POA: Diagnosis not present

## 2017-01-22 DIAGNOSIS — R102 Pelvic and perineal pain: Secondary | ICD-10-CM | POA: Diagnosis not present

## 2017-01-22 DIAGNOSIS — Z681 Body mass index (BMI) 19 or less, adult: Secondary | ICD-10-CM | POA: Diagnosis not present

## 2017-01-24 MED FILL — FLUCONAZOLE 150 MG TABLET: 150 | 30 days supply | Qty: 3 | Fill #0

## 2017-01-24 MED FILL — CEPHALEXIN 500 MG CAPSULE: 500 | 7 days supply | Qty: 14 | Fill #0

## 2017-02-06 ENCOUNTER — Ambulatory Visit (INDEPENDENT_AMBULATORY_CARE_PROVIDER_SITE_OTHER): Payer: 59 | Admitting: Family Medicine

## 2017-02-06 ENCOUNTER — Encounter: Payer: Self-pay | Admitting: Family Medicine

## 2017-02-06 VITALS — BP 128/84 | HR 89 | Temp 98.2°F | Wt 130.0 lb

## 2017-02-06 DIAGNOSIS — G43119 Migraine with aura, intractable, without status migrainosus: Secondary | ICD-10-CM | POA: Diagnosis not present

## 2017-02-06 DIAGNOSIS — G43109 Migraine with aura, not intractable, without status migrainosus: Secondary | ICD-10-CM | POA: Insufficient documentation

## 2017-02-06 DIAGNOSIS — G43009 Migraine without aura, not intractable, without status migrainosus: Secondary | ICD-10-CM | POA: Insufficient documentation

## 2017-02-06 MED ORDER — PROMETHAZINE HCL 12.5 MG PO TABS: 12.5000 mg | ORAL_TABLET | Freq: Three times a day (TID) | ORAL | 0 refills | Status: DC | PRN

## 2017-02-06 MED ORDER — KETOROLAC TROMETHAMINE 30 MG/ML IJ SOLN
30.0000 mg | Freq: Once | INTRAMUSCULAR | Status: AC
  Administered 2017-02-06: 30 mg via INTRAMUSCULAR

## 2017-02-06 MED FILL — PROMETHAZINE 12.5 MG TABLET: 12.5 | 3 days supply | Qty: 10 | Fill #0

## 2017-02-06 NOTE — Patient Instructions (Addendum)
It was great to see you today!  Sent in rx for phenergan to your pharmacy - use caution as it may make you sleepy  Gave toradol today for migraine - no NSAIDs for next 24 hours  Return or visit PCP if not improving or any new neuro symptoms, fevers, neck stiffness, etc  Recommend you discuss with PCP about switching to non-estrogen containing OCPs due to history of migraine with aura  Be well, Dr. Pollie Meyer    Migraine Headache A migraine headache is an intense, throbbing pain on one side or both sides of the head. Migraines may also cause other symptoms, such as nausea, vomiting, and sensitivity to light and noise. What are the causes? Doing or taking certain things may also trigger migraines, such as:  Alcohol.  Smoking.  Medicines, such as: ? Medicine used to treat chest pain (nitroglycerine). ? Birth control pills. ? Estrogen pills. ? Certain blood pressure medicines.  Aged cheeses, chocolate, or caffeine.  Foods or drinks that contain nitrates, glutamate, aspartame, or tyramine.  Physical activity.  Other things that may trigger a migraine include:  Menstruation.  Pregnancy.  Hunger.  Stress, lack of sleep, too much sleep, or fatigue.  Weather changes.  What increases the risk? The following factors may make you more likely to experience migraine headaches:  Age. Risk increases with age.  Family history of migraine headaches.  Being Caucasian.  Depression and anxiety.  Obesity.  Being a woman.  Having a hole in the heart (patent foramen ovale) or other heart problems.  What are the signs or symptoms? The main symptom of this condition is pulsating or throbbing pain. Pain may:  Happen in any area of the head, such as on one side or both sides.  Interfere with daily activities.  Get worse with physical activity.  Get worse with exposure to bright lights or loud noises.  Other symptoms may  include:  Nausea.  Vomiting.  Dizziness.  General sensitivity to bright lights, loud noises, or smells.  Before you get a migraine, you may get warning signs that a migraine is developing (aura). An aura may include:  Seeing flashing lights or having blind spots.  Seeing bright spots, halos, or zigzag lines.  Having tunnel vision or blurred vision.  Having numbness or a tingling feeling.  Having trouble talking.  Having muscle weakness.  How is this diagnosed? A migraine headache can be diagnosed based on:  Your symptoms.  A physical exam.  Tests, such as CT scan or MRI of the head. These imaging tests can help rule out other causes of headaches.  Taking fluid from the spine (lumbar puncture) and analyzing it (cerebrospinal fluid analysis, or CSF analysis).  How is this treated? A migraine headache is usually treated with medicines that:  Relieve pain.  Relieve nausea.  Prevent migraines from coming back.  Treatment may also include:  Acupuncture.  Lifestyle changes like avoiding foods that trigger migraines.  Follow these instructions at home: Medicines  Take over-the-counter and prescription medicines only as told by your health care provider.  Do not drive or use heavy machinery while taking prescription pain medicine.  To prevent or treat constipation while you are taking prescription pain medicine, your health care provider may recommend that you: ? Drink enough fluid to keep your urine clear or pale yellow. ? Take over-the-counter or prescription medicines. ? Eat foods that are high in fiber, such as fresh fruits and vegetables, whole grains, and beans. ? Limit foods that are  high in fat and processed sugars, such as fried and sweet foods. Lifestyle  Avoid alcohol use.  Do not use any products that contain nicotine or tobacco, such as cigarettes and e-cigarettes. If you need help quitting, ask your health care provider.  Get at least 8 hours  of sleep every night.  Limit your stress. General instructions   Keep a journal to find out what may trigger your migraine headaches. For example, write down: ? What you eat and drink. ? How much sleep you get. ? Any change to your diet or medicines.  If you have a migraine: ? Avoid things that make your symptoms worse, such as bright lights. ? It may help to lie down in a dark, quiet room. ? Do not drive or use heavy machinery. ? Ask your health care provider what activities are safe for you while you are experiencing symptoms.  Keep all follow-up visits as told by your health care provider. This is important. Contact a health care provider if:  You develop symptoms that are different or more severe than your usual migraine symptoms. Get help right away if:  Your migraine becomes severe.  You have a fever.  You have a stiff neck.  You have vision loss.  Your muscles feel weak or like you cannot control them.  You start to lose your balance often.  You develop trouble walking.  You faint. This information is not intended to replace advice given to you by your health care provider. Make sure you discuss any questions you have with your health care provider. Document Released: 05/28/2005 Document Revised: 12/16/2015 Document Reviewed: 11/14/2015 Elsevier Interactive Patient Education  2017 [employer].

## 2017-02-06 NOTE — Progress Notes (Signed)
Date of Visit: 02/06/2017   HPI:  Patient presents for a same day appointment to discuss migraine headache.  Headache has been present for 2 days, began in the middle of the night on Monday night/Tuesday AM. Has nausea but no vomiting. Located on R side, pounding. Has some blurry vision in R eye as well. Gets migraines 1-2 times a year, and this is a typical migraine for her. Has tried treatment at home with tylenol 2g total yesterday and ibuprofen 800mg  x3 doses without significant improvement. In the past has gotten a toradol shot for migraines, which worked well. Denies any history of kidney, cardiac disease or gastric ulcers. No neck stiffness or fevers. Does get a visual aura with her migraines and is taking estrogen-containing OCPs. 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  ROS: See HPI  PMFSH: reviewed, no significant pmhx   PHYSICAL EXAM: BP 128/84   Pulse 89   Temp 98.2 F (36.8 C) (Oral)   Wt 130 lb (59 kg)   SpO2 99%   BMI 19.77 kg/m   LMP 02/02/17 Gen: no acute distress, pleasant, cooperative HEENT: normocephalic, atraumatic, pupils equal round and reactive to light. No nuchal rigidity Neuro: cranial nerves II-XII tested and intact. Speech normal. Full strength bilat upper and lower ext. Normal FNF. Negative romberg. Gait normal.  ASSESSMENT/PLAN:  1. Intractable migraine - no red flags, this is a typical migraine syndrome for her. Neuro exam unremarkable. Plan: - toradol 30mg  IM here in clinic - phenergan 12.5mg  tabs to use as needed after work, aware of risk of sedation - no NSAIDs next 24 hours since giving toradol - given return precautions  2. Contraception - did not address in detail, but recommended switching to progesterone-only form of contraception given her history of migraine with aura and increased cardiovascular risks. Encouraged her to discuss with PCP.   FOLLOW  UP: Follow up as needed if symptoms worsen or fail to improve.    GrenadaBrittany J. Pollie MeyerMcIntyre, MD [facility]

## 2017-02-20 DIAGNOSIS — R87612 Low grade squamous intraepithelial lesion on cytologic smear of cervix (LGSIL): Secondary | ICD-10-CM | POA: Diagnosis not present

## 2017-02-20 DIAGNOSIS — Z3202 Encounter for pregnancy test, result negative: Secondary | ICD-10-CM | POA: Diagnosis not present

## 2017-02-20 DIAGNOSIS — N870 Mild cervical dysplasia: Secondary | ICD-10-CM | POA: Diagnosis not present

## 2017-02-25 MED FILL — DROSPIR-ETH ESTRA 3/.02 MG: 3-0.02 | 84 days supply | Qty: 84 | Fill #2

## 2017-03-14 DIAGNOSIS — H9313 Tinnitus, bilateral: Secondary | ICD-10-CM | POA: Insufficient documentation

## 2017-03-14 DIAGNOSIS — Z0110 Encounter for examination of ears and hearing without abnormal findings: Secondary | ICD-10-CM | POA: Diagnosis not present

## 2017-03-14 DIAGNOSIS — H903 Sensorineural hearing loss, bilateral: Secondary | ICD-10-CM | POA: Insufficient documentation

## 2017-03-14 DIAGNOSIS — H93A1 Pulsatile tinnitus, right ear: Secondary | ICD-10-CM | POA: Diagnosis not present

## 2017-05-05 IMAGING — US US TRANSVAGINAL NON-OB
1 series · 14 of 25 positions shown · non-contrast
Comparison: 09/20/2015

CLINICAL DATA: Right lower quadrant abdominal pain for 2 days.

EXAM:
TRANSABDOMINAL AND TRANSVAGINAL ULTRASOUND OF PELVIS
TECHNIQUE: Both transabdominal and transvaginal ultrasound examinations of the
pelvis were performed. Transabdominal technique was performed for
global imaging of the pelvis including uterus, ovaries, adnexal
regions, and pelvic cul-de-sac. It was necessary to proceed with
endovaginal exam following the transabdominal exam to visualize the
endometrium and ovaries..

[Series 1: us transvaginal non-ob · 0.24mm/px · 14 of 86 slices shown]
[im 1/86]
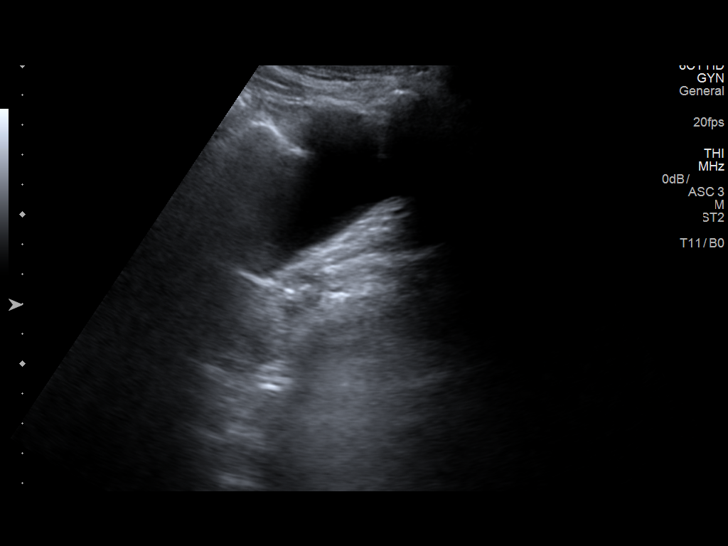
[im 8/86]
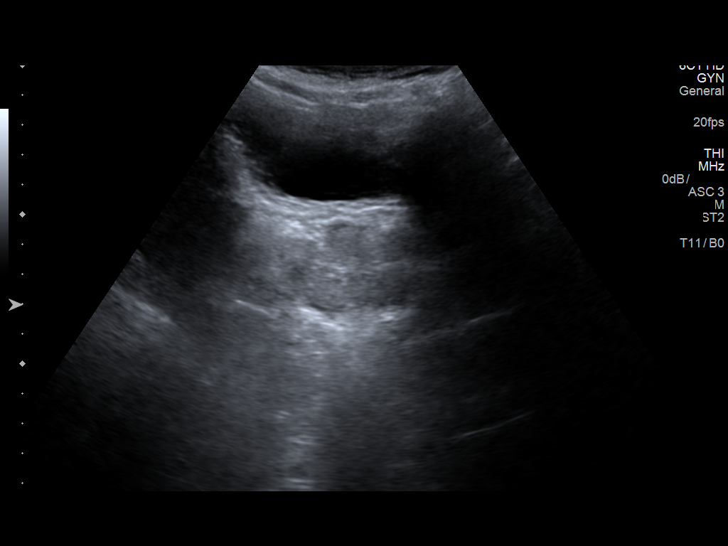
[im 15/86]
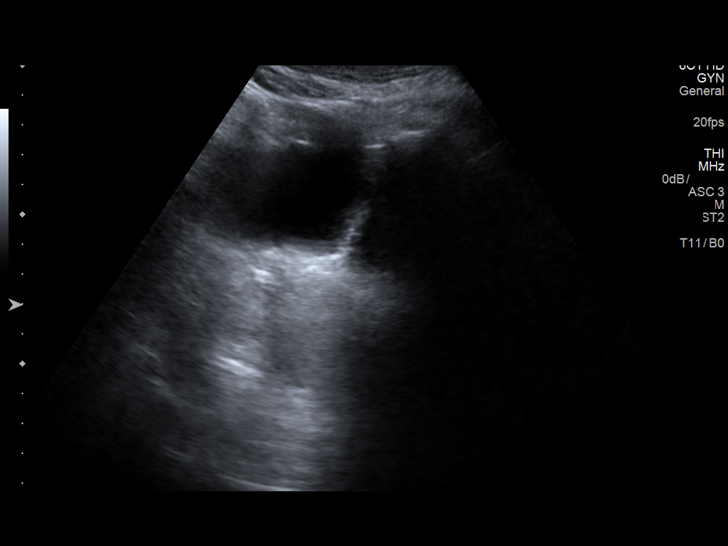
[im 22/86]
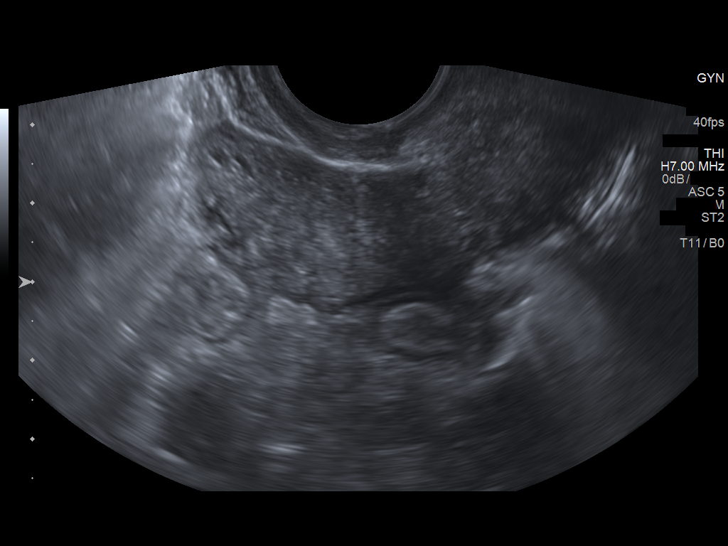
[im 29/86]
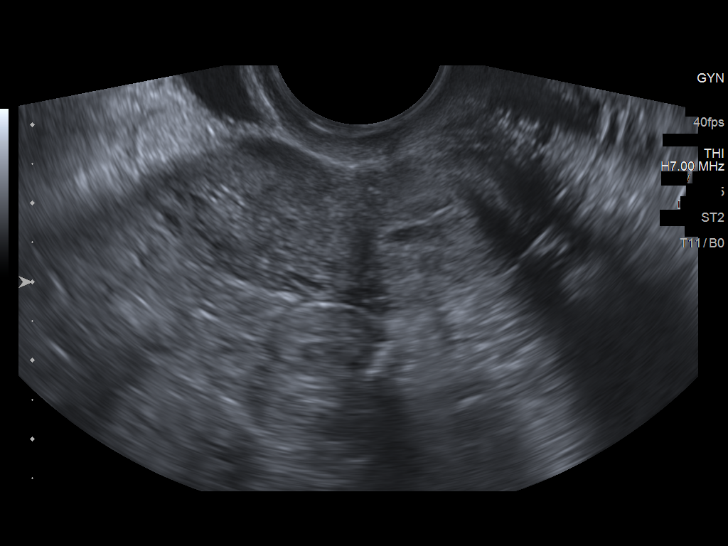
[im 32/86]
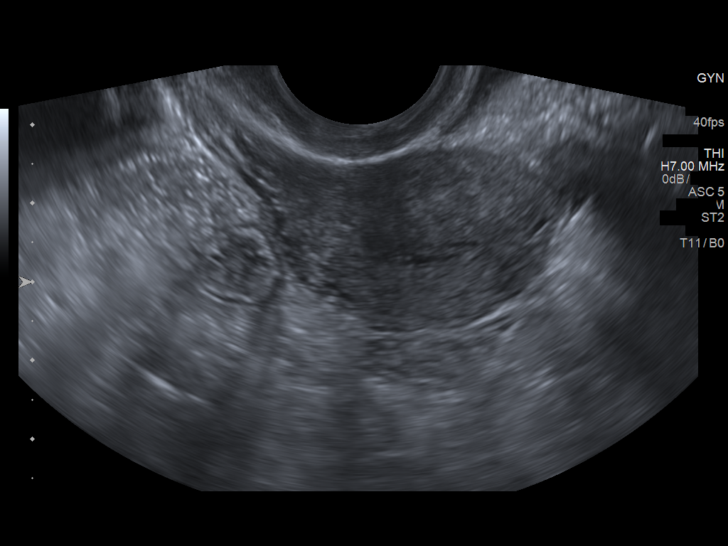
[im 39/86]
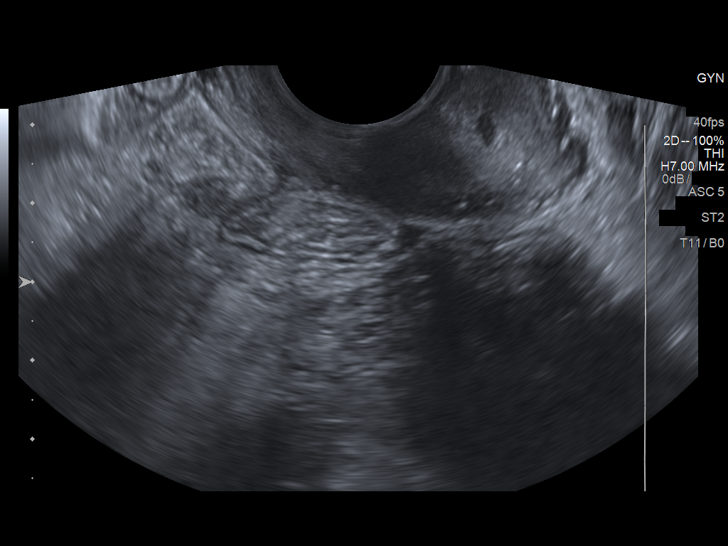
[im 47/86]
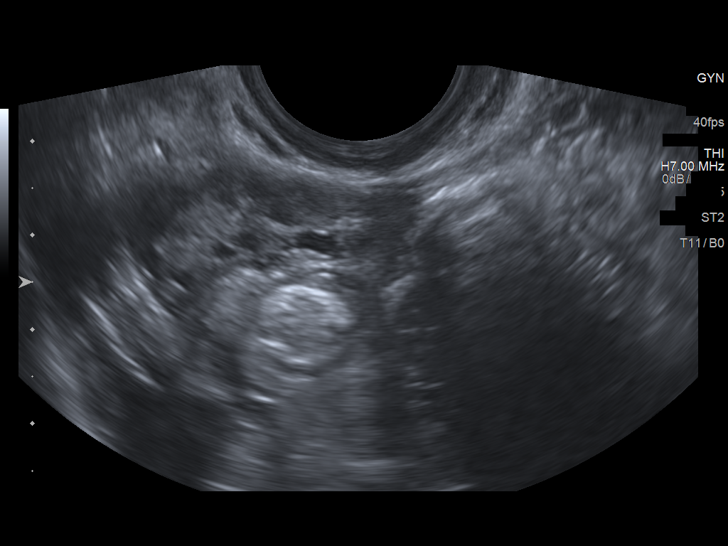
[im 54/86]
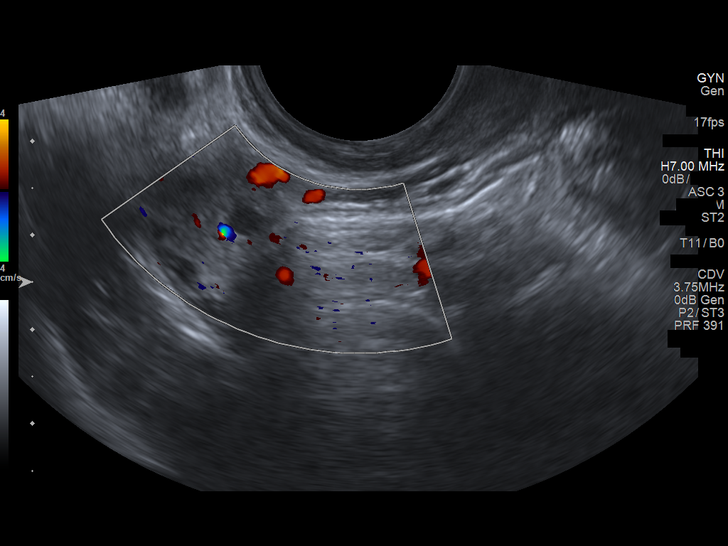
[im 57/86]
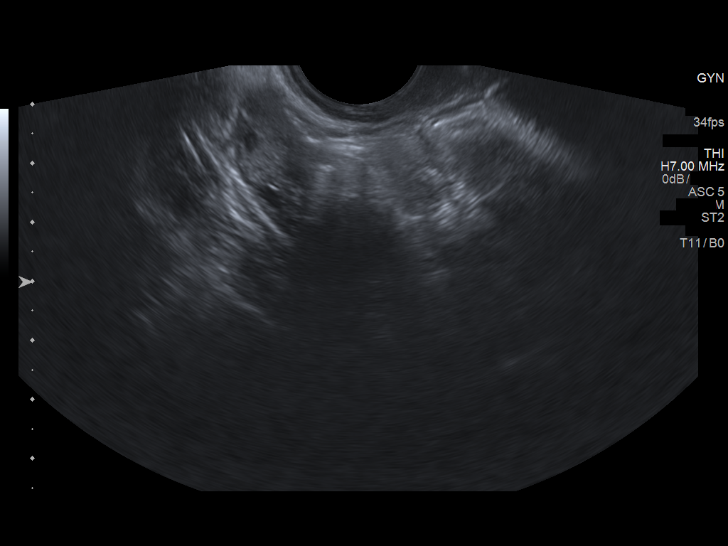
[im 64/86]
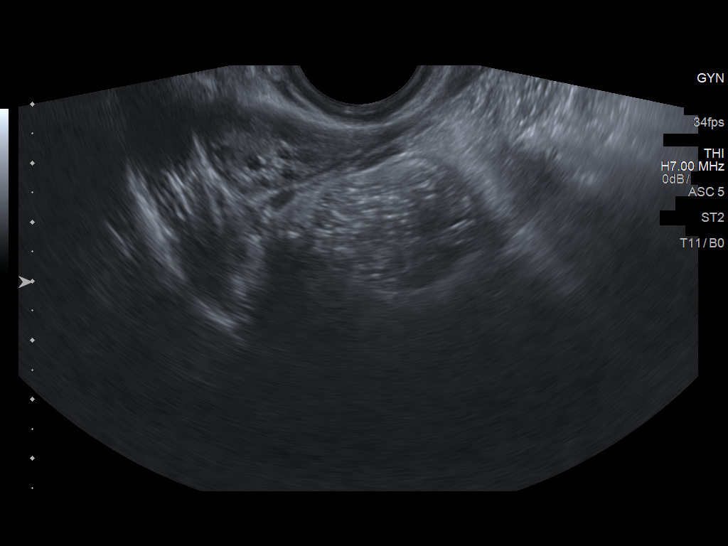
[im 71/86]
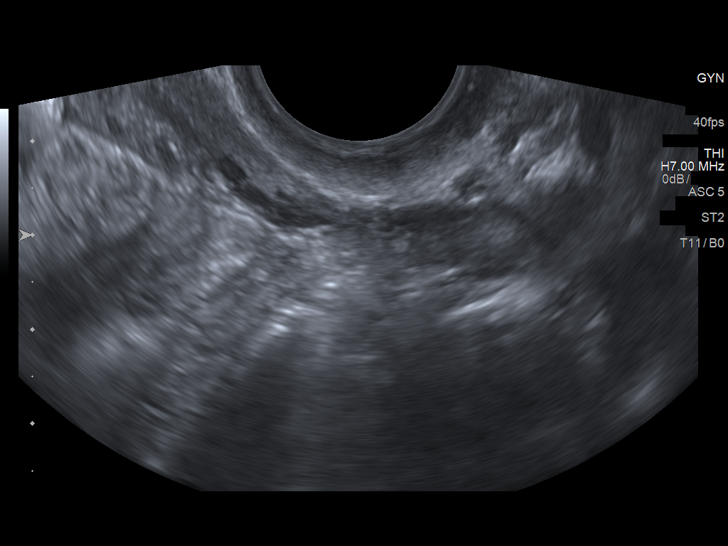
[im 78/86]
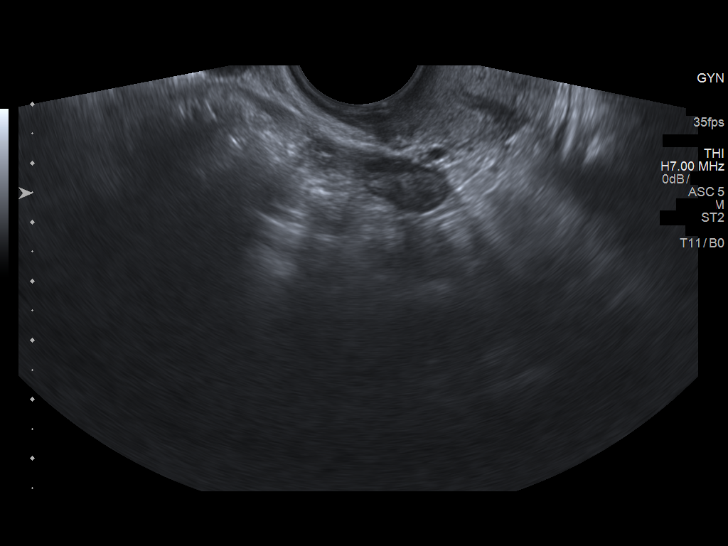
[im 86/86]
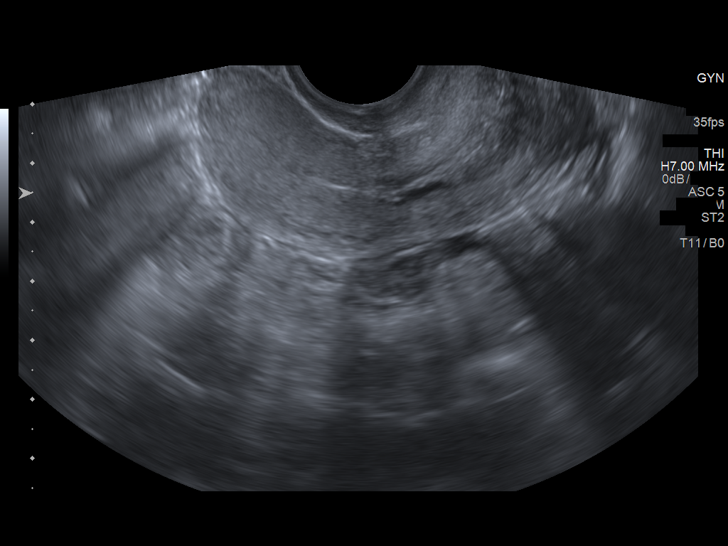

[14 of 25 positions shown; findings below may reference images not displayed]

FINDINGS: Uterus

Measurements: 6.8 by 2.7 by 3.9 cm. No fibroids or other mass
visualized.

Endometrium

Thickness: 1 mm.  No focal abnormality visualized.

Right ovary

Measurements: 1.9 by 1.7 by 2.8 cm (volume 4.7 cc). Normal
appearance/no adnexal mass.

Left ovary

Measurements: 1.9 by 0.8 by 2.2 cm (volume 1.7 cc). Normal
appearance/no adnexal mass.

Other findings

Trace free pelvic fluid, image 88, likely physiologic.
IMPRESSION: 1. No acute abnormality involving the uterus or ovaries is
identified.
2. Smaller than average ovaries for age.

## 2017-05-16 MED FILL — ONDANSETRON HCL 4 MG TABLET: 4 | 10 days supply | Qty: 30 | Fill #0

## 2017-05-16 MED FILL — DROSPIR-ETH ESTRA 3/.02 MG: 3-0.02 | 84 days supply | Qty: 84 | Fill #3

## 2017-07-10 ENCOUNTER — Other Ambulatory Visit: Payer: Self-pay | Admitting: Internal Medicine

## 2017-07-10 MED ORDER — CEPHALEXIN 500 MG PO CAPS: 500.0000 mg | ORAL_CAPSULE | Freq: Two times a day (BID) | ORAL | 0 refills | Status: AC

## 2017-07-10 MED FILL — CEPHALEXIN 500 MG CAPSULE: 500 | 10 days supply | Qty: 20 | Fill #0

## 2017-07-10 NOTE — Progress Notes (Deleted)
Keflex prescribed for UTI

## 2017-07-16 DIAGNOSIS — Z113 Encounter for screening for infections with a predominantly sexual mode of transmission: Secondary | ICD-10-CM | POA: Diagnosis not present

## 2017-07-16 DIAGNOSIS — N9089 Other specified noninflammatory disorders of vulva and perineum: Secondary | ICD-10-CM | POA: Diagnosis not present

## 2017-07-16 DIAGNOSIS — N909 Noninflammatory disorder of vulva and perineum, unspecified: Secondary | ICD-10-CM | POA: Diagnosis not present

## 2017-07-16 DIAGNOSIS — Z114 Encounter for screening for human immunodeficiency virus [HIV]: Secondary | ICD-10-CM | POA: Diagnosis not present

## 2017-07-16 DIAGNOSIS — Z1159 Encounter for screening for other viral diseases: Secondary | ICD-10-CM | POA: Diagnosis not present

## 2017-07-16 DIAGNOSIS — Z118 Encounter for screening for other infectious and parasitic diseases: Secondary | ICD-10-CM | POA: Diagnosis not present

## 2017-07-16 MED FILL — ACETAMINOPHEN/COD #3 TABLET: 300-30 | 3 days supply | Qty: 20 | Fill #0

## 2017-07-16 MED FILL — valACYclovir HCL 1 GM TABS: 1 | 10 days supply | Qty: 20 | Fill #0

## 2017-07-24 DIAGNOSIS — H5213 Myopia, bilateral: Secondary | ICD-10-CM | POA: Diagnosis not present

## 2017-07-26 DIAGNOSIS — B009 Herpesviral infection, unspecified: Secondary | ICD-10-CM | POA: Diagnosis not present

## 2017-08-05 IMAGING — CT CT ABD-PELV W/ CM
2 of 6 series · 16 of 46 positions shown, 18 images · IV contrast (APPLIED)
Comparison: None.

CLINICAL DATA: Right lower quadrant pain and nausea for 2 days.
Initial encounter.

EXAM:
CT ABDOMEN AND PELVIS WITH CONTRAST
TECHNIQUE: Multidetector CT imaging of the abdomen and pelvis was performed
using the standard protocol following bolus administration of
intravenous contrast.
CONTRAST:  100 ml OMFEHI-5JJ IOPAMIDOL (OMFEHI-5JJ) INJECTION 61%

[Series 2: abd/ pelvis 5.0 i30f 1 · axial · 0.59mm/px · z∈[+941,+1321]mm · 13 of 84 slices shown, 15 images]
[im 4/84  soft-tissue]
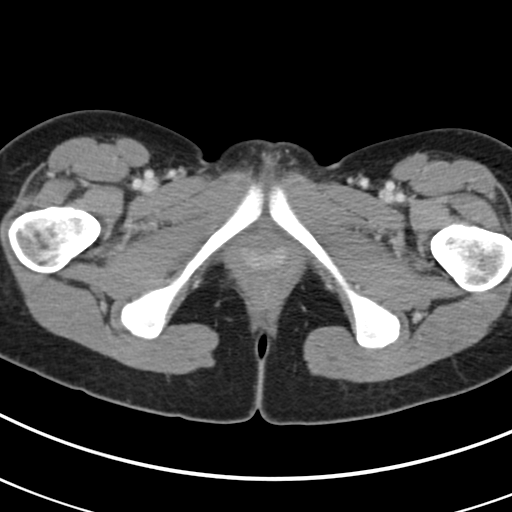
[im 4/84  bone]
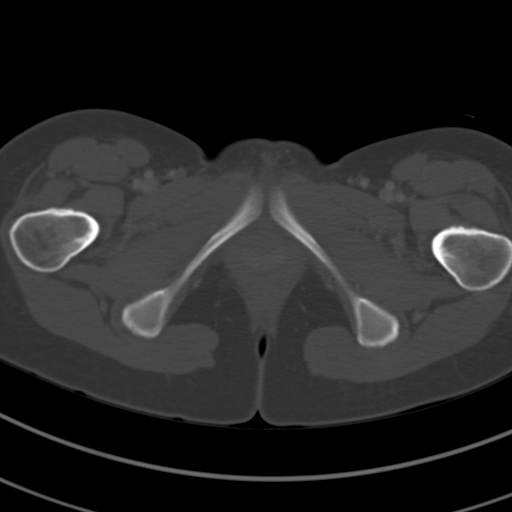
[im 12/84  soft-tissue]
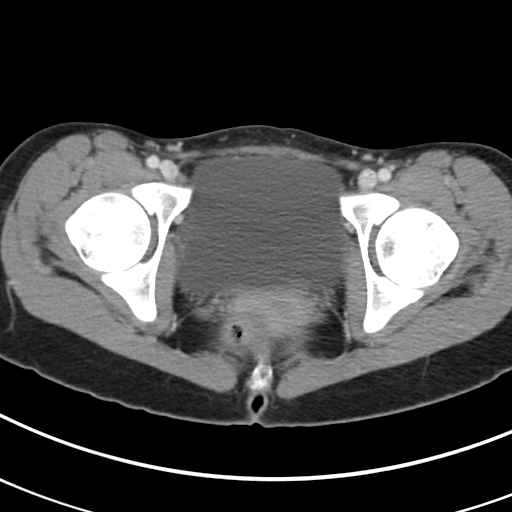
[im 16/84  soft-tissue]
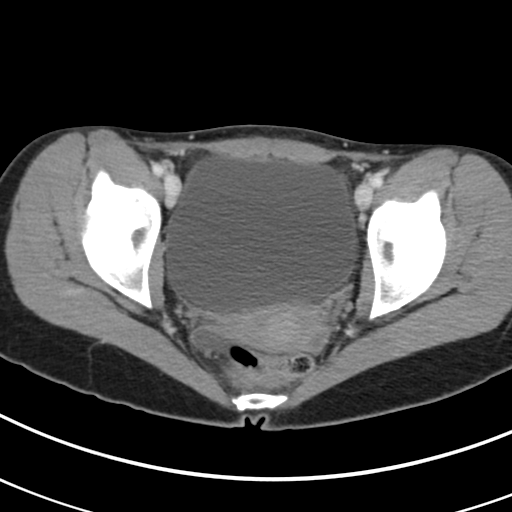
[im 24/84  soft-tissue]
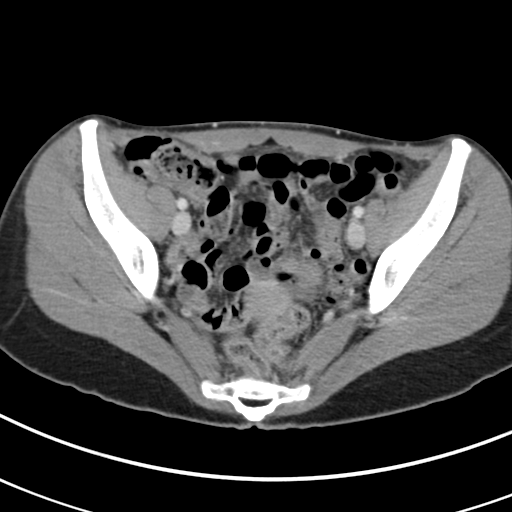
[im 28/84  soft-tissue]
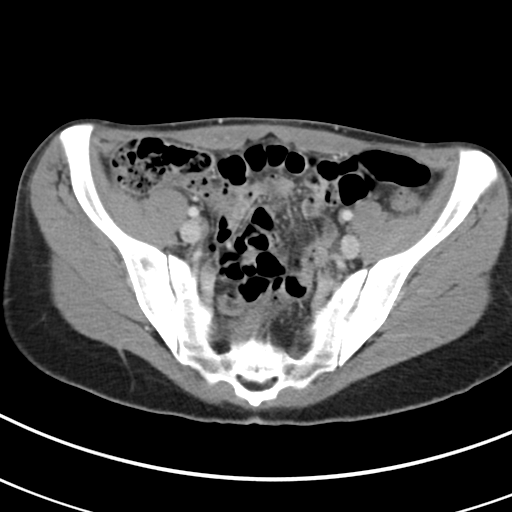
[im 36/84  soft-tissue]
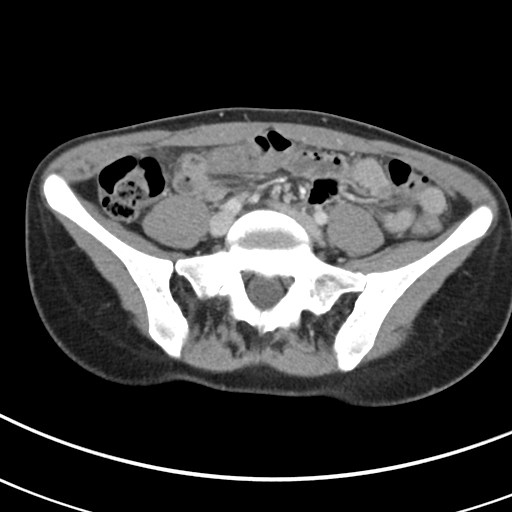
[im 44/84  soft-tissue]
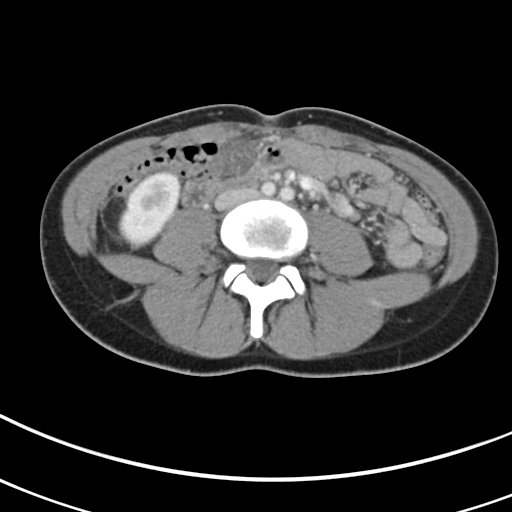
[im 48/84  soft-tissue]
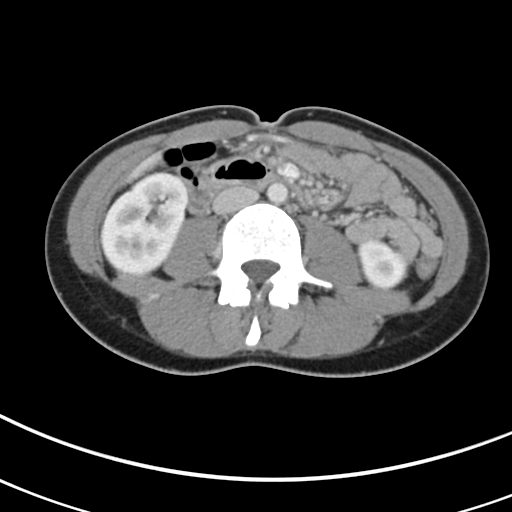
[im 56/84  soft-tissue]
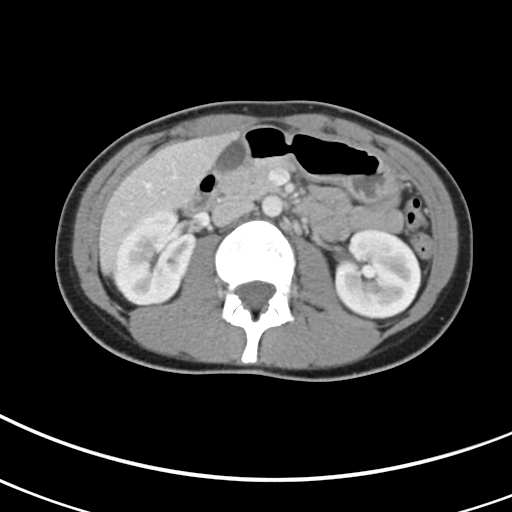
[im 56/84  bone]
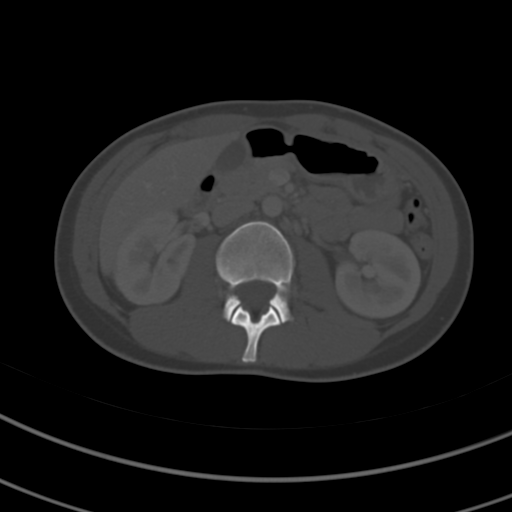
[im 60/84  soft-tissue]
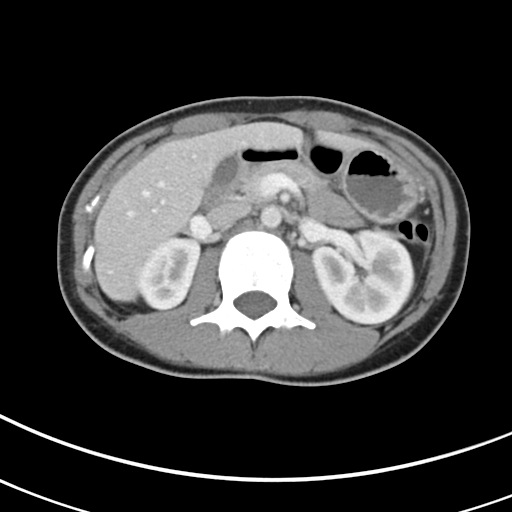
[im 68/84  soft-tissue]
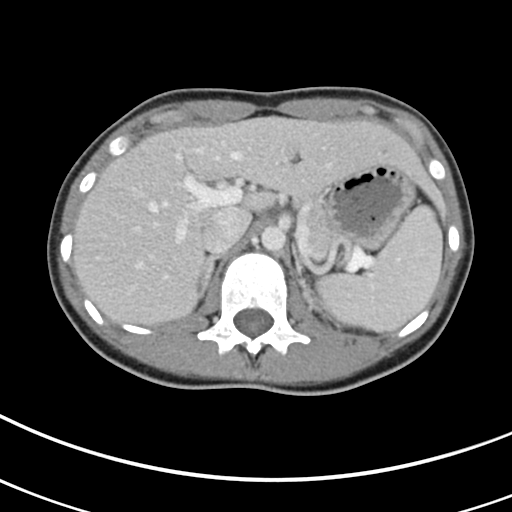
[im 72/84  soft-tissue]
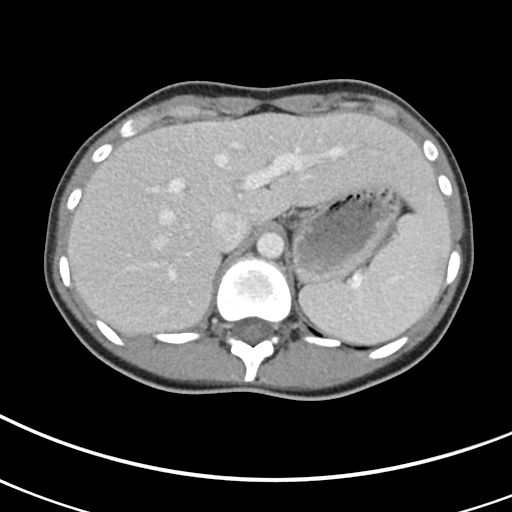
[im 80/84  soft-tissue]
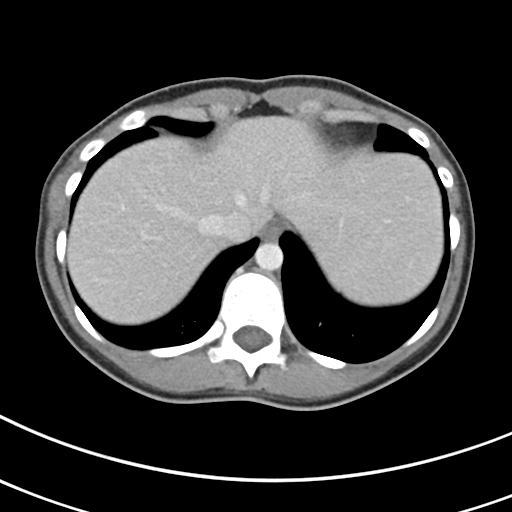

[Series 7: coronal soft tissue · coronal · 0.64mm/px · 3 of 63 slices shown]
[im 21/63  soft-tissue]
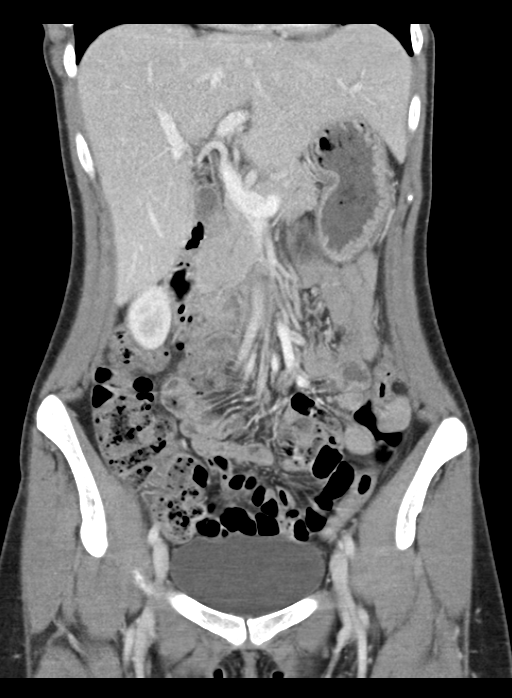
[im 28/63  soft-tissue]
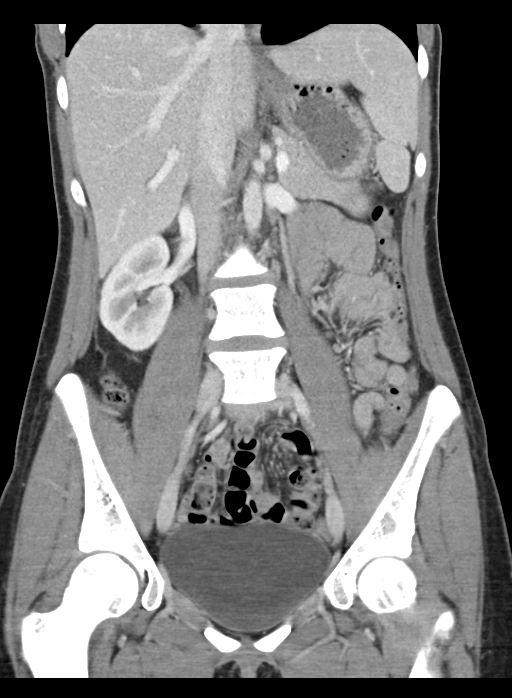
[im 35/63  soft-tissue]
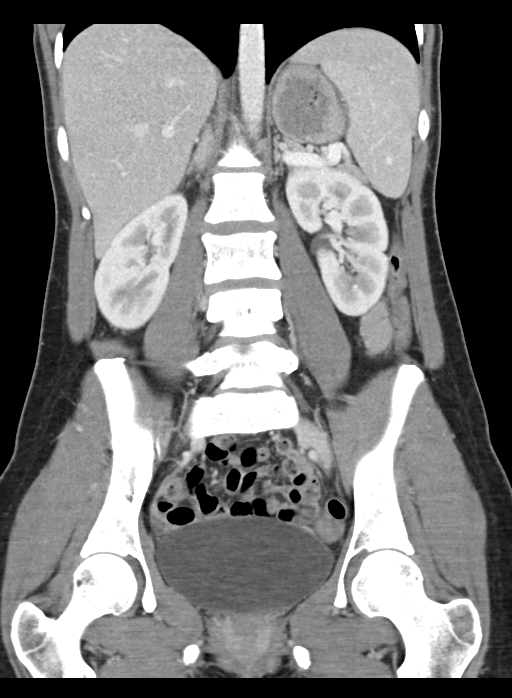

[16 of 46 positions shown; findings below may reference images not displayed]

FINDINGS: The lung bases are clear.  No pleural or pericardial effusion.

The gallbladder, liver, spleen, adrenal glands, pancreas and kidneys
appear normal.

Trace amount of free pelvic fluid is consistent with physiologic
change. Uterus, adnexa and urinary bladder appear normal. The
stomach, small and large bowel and appendix are unremarkable. There
is no lymphadenopathy. No focal bony abnormality is identified.
IMPRESSION: Negative for appendicitis.  Negative CT abdomen and pelvis.

## 2017-08-08 DIAGNOSIS — G4726 Circadian rhythm sleep disorder, shift work type: Secondary | ICD-10-CM | POA: Diagnosis not present

## 2017-08-08 DIAGNOSIS — R7989 Other specified abnormal findings of blood chemistry: Secondary | ICD-10-CM | POA: Diagnosis not present

## 2017-08-08 DIAGNOSIS — G4709 Other insomnia: Secondary | ICD-10-CM | POA: Diagnosis not present

## 2017-08-08 DIAGNOSIS — F419 Anxiety disorder, unspecified: Secondary | ICD-10-CM | POA: Diagnosis not present

## 2017-08-08 MED FILL — FLUoxetine HCL 10 MG CAPS: 10 | 30 days supply | Qty: 30 | Fill #0

## 2017-08-08 MED FILL — hydrOXYzine HCL 25 MG TABS: 25 | 10 days supply | Qty: 30 | Fill #0

## 2017-08-08 MED FILL — ZOLPIDEM TARTRATE 5 MG TABL: 5 | 30 days supply | Qty: 30 | Fill #0

## 2017-09-06 MED FILL — FLUoxetine HCL 10 MG CAPS: 10 | 30 days supply | Qty: 30 | Fill #1

## 2017-09-11 MED FILL — CYCLOBENZAPRINE 5 MG TABLET: 5 | 10 days supply | Qty: 30 | Fill #0

## 2017-09-13 MED FILL — DROSPIR-ETH ESTRA 3/.02 MG: 3-0.02 | 84 days supply | Qty: 84 | Fill #0 | Status: TO

## 2017-09-18 DIAGNOSIS — G4726 Circadian rhythm sleep disorder, shift work type: Secondary | ICD-10-CM | POA: Diagnosis not present

## 2017-09-18 DIAGNOSIS — F419 Anxiety disorder, unspecified: Secondary | ICD-10-CM | POA: Diagnosis not present

## 2017-09-18 DIAGNOSIS — N390 Urinary tract infection, site not specified: Secondary | ICD-10-CM | POA: Diagnosis not present

## 2017-09-19 MED FILL — VALACYCLOVIR HCL 500 MG TAB: 500 | 15 days supply | Qty: 30 | Fill #0

## 2017-10-21 MED FILL — ZOLPIDEM TARTRATE 5 MG TAB: 5 | 30 days supply | Qty: 30 | Fill #1

## 2017-10-21 MED FILL — hydrOXYzine HCL 25 MG TABS: 25 | 10 days supply | Qty: 30 | Fill #1

## 2017-10-21 MED FILL — FLUoxetine HCL 10 MG CAPS: 10 | 30 days supply | Qty: 30 | Fill #2

## 2017-11-12 ENCOUNTER — Other Ambulatory Visit: Payer: Self-pay | Admitting: Internal Medicine

## 2017-11-12 MED ORDER — CEPHALEXIN 500 MG PO CAPS: 500.0000 mg | ORAL_CAPSULE | Freq: Two times a day (BID) | ORAL | 0 refills | Status: AC

## 2017-11-25 MED FILL — FLUCONAZOLE 150 MG TABS: 150 | 2 days supply | Qty: 2 | Fill #0

## 2017-12-05 MED FILL — FLUoxetine HCL 10 MG CAPS: 10 | 90 days supply | Qty: 90 | Fill #0

## 2017-12-23 MED FILL — LORYNA 3-0.02 MG TABS: 3-0.02 | 28 days supply | Qty: 28 | Fill #0

## 2018-01-15 MED FILL — LORYNA 3-0.02 MG TABS: 3-0.02 | 28 days supply | Qty: 28 | Fill #0

## 2018-01-23 DIAGNOSIS — R300 Dysuria: Secondary | ICD-10-CM | POA: Diagnosis not present

## 2018-01-24 MED FILL — NITROFURANTOIN MCR 50 MG CA: 50 | 30 days supply | Qty: 30 | Fill #0

## 2018-02-06 DIAGNOSIS — Z01419 Encounter for gynecological examination (general) (routine) without abnormal findings: Secondary | ICD-10-CM | POA: Diagnosis not present

## 2018-02-06 DIAGNOSIS — N870 Mild cervical dysplasia: Secondary | ICD-10-CM | POA: Diagnosis not present

## 2018-02-06 DIAGNOSIS — Z6820 Body mass index (BMI) 20.0-20.9, adult: Secondary | ICD-10-CM | POA: Diagnosis not present

## 2018-02-14 MED FILL — LORYNA 3-0.02 MG TABS: 3-0.02 | 84 days supply | Qty: 84 | Fill #0

## 2018-03-12 MED FILL — KETOCONAZOLE 2% CREAM: 2 | 15 days supply | Qty: 30 | Fill #0

## 2018-03-12 MED FILL — FLUoxetine HCL 10 MG CAPS: 10 | 90 days supply | Qty: 90 | Fill #1

## 2018-03-12 MED FILL — VALACYCLOVIR HCL 500 MG TAB: 500 | 15 days supply | Qty: 30 | Fill #0

## 2018-03-13 MED FILL — ZOLPIDEM TARTRATE 5 MG TAB: 5 | 30 days supply | Qty: 30 | Fill #0

## 2018-04-07 MED FILL — NITROFURANTOIN MCR 50 MG CA: 50 | 30 days supply | Qty: 30 | Fill #1

## 2018-04-23 DIAGNOSIS — F430 Acute stress reaction: Secondary | ICD-10-CM | POA: Diagnosis not present

## 2018-04-28 DIAGNOSIS — F419 Anxiety disorder, unspecified: Secondary | ICD-10-CM | POA: Diagnosis not present

## 2018-04-28 MED FILL — SERTRALINE HCL 25 MG TABLET: 25 | 30 days supply | Qty: 60 | Fill #0

## 2018-04-29 MED FILL — VALACYCLOVIR HCL 500 MG TAB: 500 | 15 days supply | Qty: 30 | Fill #1

## 2018-05-02 DIAGNOSIS — F430 Acute stress reaction: Secondary | ICD-10-CM | POA: Diagnosis not present

## 2018-05-09 MED FILL — LORYNA 3-0.02 MG TABS: 3-0.02 | 84 days supply | Qty: 84 | Fill #1

## 2018-05-29 DIAGNOSIS — F419 Anxiety disorder, unspecified: Secondary | ICD-10-CM | POA: Diagnosis not present

## 2018-05-29 DIAGNOSIS — F430 Acute stress reaction: Secondary | ICD-10-CM | POA: Diagnosis not present

## 2018-06-09 DIAGNOSIS — F430 Acute stress reaction: Secondary | ICD-10-CM | POA: Diagnosis not present

## 2018-06-09 MED FILL — hydrOXYzine HCL 25 MG TABS: 25 | 10 days supply | Qty: 30 | Fill #2

## 2018-06-09 MED FILL — SERTRALINE HCL 50 MG TABLET: 50 | 90 days supply | Qty: 90 | Fill #0

## 2018-06-09 MED FILL — NITROFURANTOIN MACROCRYSTAL: 50 | 30 days supply | Qty: 30 | Fill #2

## 2018-06-26 ENCOUNTER — Telehealth: Payer: Self-pay | Admitting: *Deleted

## 2018-06-26 NOTE — Telephone Encounter (Signed)
Okay with me. Okay to schedule as soon as requested.

## 2018-06-26 NOTE — Telephone Encounter (Signed)
OK with me.

## 2018-06-26 NOTE — Telephone Encounter (Signed)
Copied from CRM [id number]. Topic: Appointment Scheduling - Transfer of Care >> Jun 26, 2018  4:29 PM Angela Nevin wrote: Pt is requesting to transfer FROM: Ruth Stone Pt is requesting to transfer TO: Helane Rima Reason for requested transfer: Did not specify

## 2018-06-27 NOTE — Telephone Encounter (Signed)
Contact patient to schedule TOC appointment. °

## 2018-06-27 NOTE — Telephone Encounter (Signed)
Scheduled pt for 10/14/18

## 2018-07-29 ENCOUNTER — Encounter: Payer: Self-pay | Admitting: Family Medicine

## 2018-07-29 ENCOUNTER — Ambulatory Visit (INDEPENDENT_AMBULATORY_CARE_PROVIDER_SITE_OTHER): Payer: No Typology Code available for payment source | Admitting: Family Medicine

## 2018-07-29 VITALS — BP 136/98 | HR 67 | Temp 97.5°F | Ht 68.0 in | Wt 132.4 lb

## 2018-07-29 DIAGNOSIS — G43119 Migraine with aura, intractable, without status migrainosus: Secondary | ICD-10-CM | POA: Diagnosis not present

## 2018-07-29 DIAGNOSIS — E538 Deficiency of other specified B group vitamins: Secondary | ICD-10-CM

## 2018-07-29 DIAGNOSIS — Z13220 Encounter for screening for lipoid disorders: Secondary | ICD-10-CM | POA: Diagnosis not present

## 2018-07-29 DIAGNOSIS — F418 Other specified anxiety disorders: Secondary | ICD-10-CM

## 2018-07-29 DIAGNOSIS — R5381 Other malaise: Secondary | ICD-10-CM

## 2018-07-29 DIAGNOSIS — E559 Vitamin D deficiency, unspecified: Secondary | ICD-10-CM | POA: Diagnosis not present

## 2018-07-29 DIAGNOSIS — G4726 Circadian rhythm sleep disorder, shift work type: Secondary | ICD-10-CM

## 2018-07-29 DIAGNOSIS — R5383 Other fatigue: Secondary | ICD-10-CM | POA: Diagnosis not present

## 2018-07-29 LAB — CBC WITH DIFFERENTIAL/PLATELET
Basophils Absolute: 0 10*3/uL (ref 0.0–0.1)
Basophils Relative: 0.4 % (ref 0.0–3.0)
Eosinophils Absolute: 0 10*3/uL (ref 0.0–0.7)
Eosinophils Relative: 0.3 % (ref 0.0–5.0)
HCT: 42.2 % (ref 36.0–46.0)
Hemoglobin: 14.3 g/dL (ref 12.0–15.0)
Lymphocytes Relative: 11.4 % — ABNORMAL LOW (ref 12.0–46.0)
Lymphs Abs: 1.2 10*3/uL (ref 0.7–4.0)
MCHC: 33.8 g/dL (ref 30.0–36.0)
MCV: 89.5 fl (ref 78.0–100.0)
Monocytes Absolute: 0.7 10*3/uL (ref 0.1–1.0)
Monocytes Relative: 6.8 % (ref 3.0–12.0)
Neutro Abs: 8.3 10*3/uL — ABNORMAL HIGH (ref 1.4–7.7)
Neutrophils Relative %: 81.1 % — ABNORMAL HIGH (ref 43.0–77.0)
Platelets: 374 10*3/uL (ref 150.0–400.0)
RBC: 4.72 Mil/uL (ref 3.87–5.11)
RDW: 13.4 % (ref 11.5–15.5)
WBC: 10.2 10*3/uL (ref 4.0–10.5)

## 2018-07-29 LAB — COMPREHENSIVE METABOLIC PANEL
ALT: 19 U/L (ref 0–35)
AST: 35 U/L (ref 0–37)
Albumin: 4.5 g/dL (ref 3.5–5.2)
Alkaline Phosphatase: 41 U/L (ref 39–117)
BUN: 15 mg/dL (ref 6–23)
CO2: 27 mEq/L (ref 19–32)
Calcium: 10 mg/dL (ref 8.4–10.5)
Chloride: 101 mEq/L (ref 96–112)
Creatinine, Ser: 0.82 mg/dL (ref 0.40–1.20)
GFR: 82.07 mL/min (ref >60.00)
Glucose, Bld: 81 mg/dL (ref 70–99)
Potassium: 4.6 mEq/L (ref 3.5–5.1)
Sodium: 136 mEq/L (ref 135–145)
Total Bilirubin: 0.4 mg/dL (ref 0.2–1.2)
Total Protein: 7.4 g/dL (ref 6.0–8.3)

## 2018-07-29 LAB — LIPID PANEL
Cholesterol: 181 mg/dL (ref 0–200)
HDL: 65.9 mg/dL (ref >39.00)
LDL Cholesterol: 93 mg/dL (ref 0–99)
NonHDL: 115.28
Total CHOL/HDL Ratio: 3
Triglycerides: 111 mg/dL (ref 0.0–149.0)
VLDL: 22.2 mg/dL (ref 0.0–40.0)

## 2018-07-29 LAB — TSH: TSH: 3.09 u[IU]/mL (ref 0.35–4.50)

## 2018-07-29 LAB — VITAMIN B12: Vitamin B-12: 204 pg/mL — ABNORMAL LOW (ref 211–911)

## 2018-07-29 LAB — VITAMIN D 25 HYDROXY (VIT D DEFICIENCY, FRACTURES): VITD: 17.88 ng/mL — ABNORMAL LOW (ref 30.00–100.00)

## 2018-07-29 MED ORDER — CLONAZEPAM 1 MG PO TABS: 1.0000 mg | ORAL_TABLET | Freq: Two times a day (BID) | ORAL | 4 refills | Status: DC | PRN

## 2018-07-29 MED ORDER — SERTRALINE HCL 100 MG PO TABS: 100.0000 mg | ORAL_TABLET | Freq: Every day | ORAL | 3 refills | Status: DC

## 2018-07-29 MED FILL — SERTRALINE HCL 100 MG TAB: 100 | 30 days supply | Qty: 30 | Fill #0

## 2018-07-29 MED FILL — clonazePAM 1 MG TABS: 1 | 30 days supply | Qty: 60 | Fill #0

## 2018-07-30 LAB — IRON,TIBC AND FERRITIN PANEL
%SAT: 21 % (calc) (ref 16–45)
Ferritin: 33 ng/mL (ref 16–154)
Iron: 105 ug/dL (ref 40–190)
TIBC: 494 mcg/dL (calc) — ABNORMAL HIGH (ref 250–450)

## 2018-07-30 MED ORDER — CHOLECALCIFEROL 1.25 MG (50000 UT) PO TABS: ORAL_TABLET | ORAL | 0 refills | Status: DC

## 2018-07-30 MED FILL — VIT D3-50 50,000 UNITS CAPS: 1.25 MG | 12 days supply | Qty: 12 | Fill #0

## 2018-08-09 ENCOUNTER — Encounter: Payer: Self-pay | Admitting: Family Medicine

## 2018-08-09 DIAGNOSIS — F418 Other specified anxiety disorders: Secondary | ICD-10-CM | POA: Insufficient documentation

## 2018-08-09 DIAGNOSIS — E559 Vitamin D deficiency, unspecified: Secondary | ICD-10-CM | POA: Insufficient documentation

## 2018-08-09 DIAGNOSIS — G4726 Circadian rhythm sleep disorder, shift work type: Secondary | ICD-10-CM | POA: Insufficient documentation

## 2018-08-09 DIAGNOSIS — E538 Deficiency of other specified B group vitamins: Secondary | ICD-10-CM | POA: Insufficient documentation

## 2018-08-09 NOTE — Assessment & Plan Note (Signed)
Ruth Stone was informed that low vitamin D levels contributes to fatigue and are associated with obesity, breast, and colon cancer. Goal vitamin D level > 40. She will supplement as below and recheck in 3-6 months.  [x]   Vitamin D 50,000 IU x 12 weeks, then 2000 IU daily. []   2000 IU daily.

## 2018-08-09 NOTE — Assessment & Plan Note (Signed)
New. Will replace with goal > 400.

## 2018-08-09 NOTE — Assessment & Plan Note (Signed)
Fellow in [employer]. Husband in fellowship in another state. Shifting sleep. High stress situations. Underlying GAD. Has support in [place]. 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 Will check other organic reasons for anxiety. See orders for medications.

## 2018-08-09 NOTE — Progress Notes (Signed)
Ruth Stone is a 28 y.o. female is here for follow up.  History of Present Illness:   HPI: See Assessment and Plan section for Problem Based Charting of issues discussed today.   Health Maintenance Due  Topic Date Due  . INFLUENZA VACCINE  01/09/2018  . PAP SMEAR-Modifier  08/12/2018   Depression screen [facility] 2/9 07/29/2018 08/21/2016  Decreased Interest 1 0  Down, Depressed, Hopeless 2 0  PHQ - 2 Score 3 0  Altered sleeping 0 0  Tired, decreased energy 2 1  Change in appetite 1 0  Feeling bad or failure about yourself  2 0  Trouble concentrating 1 0  Moving slowly or fidgety/restless 0 0  Suicidal thoughts 0 0  PHQ-9 Score 9 1   PMHx, SurgHx, SocialHx, FamHx, Medications, and Allergies were reviewed in the Visit Navigator and updated as appropriate.   Patient Active Problem List   Diagnosis Date Noted  . Shifting sleep-work schedule 08/09/2018  . Situational anxiety 08/09/2018  . Vitamin D deficiency 08/09/2018  . B12 deficiency 08/09/2018  . Tinnitus, bilateral 03/14/2017  . Sensorineural hearing loss (SNHL), bilateral 03/14/2017  . Migraine with aura 02/06/2017  . Insomnia 08/12/2015   Social History   Tobacco Use  . Smoking status: Never Smoker  . Smokeless tobacco: Never Used  Substance Use Topics  . Alcohol use: Yes    Alcohol/week: 4.0 - 5.0 standard drinks    Types: 4 - 5 Standard drinks or equivalent per week  . Drug use: No   Current Medications and Allergies:   Current Outpatient Medications:  .  clonazePAM (KLONOPIN) 1 MG tablet, Take 1 tablet (1 mg total) by mouth 2 (two) times daily as needed for anxiety., Disp: 60 tablet, Rfl: 4 .  drospirenone-ethinyl estradiol (GIANVI) 3-0.02 MG tablet, Take 1 tablet by mouth daily., Disp: 3 Package, Rfl: 4 .  nitrofurantoin (MACRODANTIN) 50 MG capsule, , Disp: , Rfl:  .  sertraline (ZOLOFT) 100 MG tablet, Take 1 tablet (100 mg total) by mouth daily., Disp: 30 tablet, Rfl: 3 .  valACYclovir (VALTREX) 500  MG tablet, , Disp: , Rfl:  .  zolpidem (AMBIEN) 5 MG tablet, , Disp: , Rfl:  .  Cholecalciferol 1.25 MG (50000 UT) TABS, 50,000 units PO qwk for 12 weeks., Disp: 12 tablet, Rfl: 0  No Known Allergies Review of Systems   Pertinent items are noted in the HPI. Otherwise, ROS is negative.  Vitals:   Vitals:   07/29/18 1134  BP: (!) 136/98  Pulse: 67  Temp: (!) 97.5 F (36.4 C)  TempSrc: Oral  SpO2: 96%  Weight: 132 lb 6.4 oz (60.1 kg)  Height: 5\' 8"  (1.727 m)     Body mass index is 20.13 kg/m.  Physical Exam:   Physical Exam Vitals signs and nursing note reviewed.  HENT:     Head: Normocephalic and atraumatic.  Eyes:     Pupils: Pupils are equal, round, and reactive to light.  Neck:     Musculoskeletal: Normal range of motion and neck supple.  Cardiovascular:     Rate and Rhythm: Normal rate and regular rhythm.     Heart sounds: Normal heart sounds.  Pulmonary:     Effort: Pulmonary effort is normal.  Abdominal:     Palpations: Abdomen is soft.  Skin:    General: Skin is warm.  Psychiatric:        Behavior: Behavior normal.     Results for orders placed or performed in  visit on 07/29/18  CBC with Differential/Platelet  Result Value Ref Range   WBC 10.2 4.0 - 10.5 K/uL   RBC 4.72 3.87 - 5.11 Mil/uL   Hemoglobin 14.3 12.0 - 15.0 g/dL   HCT 16.1 09.6 - 04.5 %   MCV 89.5 78.0 - 100.0 fl   MCHC 33.8 30.0 - 36.0 g/dL   RDW 40.9 81.1 - 91.4 %   Platelets 374.0 150.0 - 400.0 K/uL   Neutrophils Relative % 81.1 (H) 43.0 - 77.0 %   Lymphocytes Relative 11.4 (L) 12.0 - 46.0 %   Monocytes Relative 6.8 3.0 - 12.0 %   Eosinophils Relative 0.3 0.0 - 5.0 %   Basophils Relative 0.4 0.0 - 3.0 %   Neutro Abs 8.3 (H) 1.4 - 7.7 K/uL   Lymphs Abs 1.2 0.7 - 4.0 K/uL   Monocytes Absolute 0.7 0.1 - 1.0 K/uL   Eosinophils Absolute 0.0 0.0 - 0.7 K/uL   Basophils Absolute 0.0 0.0 - 0.1 K/uL  Comprehensive metabolic panel  Result Value Ref Range   Sodium 136 135 - 145 mEq/L    Potassium 4.6 3.5 - 5.1 mEq/L   Chloride 101 96 - 112 mEq/L   CO2 27 19 - 32 mEq/L   Glucose, Bld 81 70 - 99 mg/dL   BUN 15 6 - 23 mg/dL   Creatinine, Ser 7.82 0.40 - 1.20 mg/dL   Total Bilirubin 0.4 0.2 - 1.2 mg/dL   Alkaline Phosphatase 41 39 - 117 U/L   AST 35 0 - 37 U/L   ALT 19 0 - 35 U/L   Total Protein 7.4 6.0 - 8.3 g/dL   Albumin 4.5 3.5 - 5.2 g/dL   Calcium 95.6 8.4 - 21.3 mg/dL   GFR 08.65 >78.46 mL/min  Lipid panel  Result Value Ref Range   Cholesterol 181 0 - 200 mg/dL   Triglycerides 962.9 0.0 - 149.0 mg/dL   HDL 52.84 >13.24 mg/dL   VLDL 40.1 0.0 - 02.7 mg/dL   LDL Cholesterol 93 0 - 99 mg/dL   Total CHOL/HDL Ratio 3    NonHDL 115.28   Iron, TIBC and Ferritin Panel  Result Value Ref Range   Iron 105 40 - 190 mcg/dL   TIBC 253 (H) 664 - 403 mcg/dL (calc)   %SAT 21 16 - 45 % (calc)   Ferritin 33 16 - 154 ng/mL  TSH  Result Value Ref Range   TSH 3.09 0.35 - 4.50 uIU/mL  Vitamin B12  Result Value Ref Range   Vitamin B-12 204 (L) 211 - 911 pg/mL  VITAMIN D 25 Hydroxy (Vit-D Deficiency, Fractures)  Result Value Ref Range   VITD 17.88 (L) 30.00 - 100.00 ng/mL    Assessment and Plan:   Vitamin D deficiency Marylynn was informed that low vitamin D levels contributes to fatigue and are associated with obesity, breast, and colon cancer. Goal vitamin D level > 40. She will supplement as below and recheck in 3-6 months.    Vitamin D 50,000 IU x 12 weeks, then 2000 IU daily.   2000 IU daily.   Situational anxiety Fellow in [facility]. Husband in fellowship in another state. Shifting sleep. High stress situations. Underlying GAD. Has support in [place]. 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 Will check other organic reasons for anxiety. See orders for medications.   Shifting sleep-work schedule Fellowship. Will finish and intends to practice [employer] this summer.   B12 deficiency New. Will replace with goal > 400.   Orders Placed This Encounter  Procedures  . CBC with  Differential/Platelet  . Comprehensive metabolic panel  . Lipid panel  . Iron, TIBC and Ferritin Panel  . TSH  . Vitamin B12  . VITAMIN D 25 Hydroxy (Vit-D Deficiency, Fractures)   Meds ordered this encounter  Medications  . sertraline (ZOLOFT) 100 MG tablet    Sig: Take 1 tablet (100 mg total) by mouth daily.    Dispense:  30 tablet    Refill:  3  . clonazePAM (KLONOPIN) 1 MG tablet    Sig: Take 1 tablet (1 mg total) by mouth 2 (two) times daily as needed for anxiety.    Dispense:  60 tablet    Refill:  4  . Cholecalciferol 1.25 MG (50000 UT) TABS    Sig: 50,000 units PO qwk for 12 weeks.    Dispense:  12 tablet    Refill:  0    . Reviewed expectations re: course of current medical issues. . Discussed self-management of symptoms. . Outlined signs and symptoms indicating need for more acute intervention. . Patient verbalized understanding and all questions were answered. Marland Kitchen Health Maintenance issues including appropriate healthy diet, exercise, and smoking avoidance were discussed with patient. . See orders for this visit as documented in the electronic medical record. . Patient received an After Visit Summary.  Helane Rima, DO [place], [facility] 08/09/2018

## 2018-08-09 NOTE — Assessment & Plan Note (Signed)
Fellowship. Will finish and intends to practice [employer] this summer.

## 2018-08-18 MED FILL — NITROFURANTOIN MACROCRYSTAL: 50 | 30 days supply | Qty: 30 | Fill #3

## 2018-08-18 MED FILL — DROSPIR-ETH ESTRA 3/.02 MG: 3-0.02 | 84 days supply | Qty: 84 | Fill #2

## 2018-08-18 MED FILL — ONDANSETRON ODT 4 MG TABLET: 4 | 30 days supply | Qty: 30 | Fill #0

## 2018-08-27 ENCOUNTER — Encounter: Payer: Self-pay | Admitting: Family Medicine

## 2018-08-28 MED ORDER — DIAZEPAM 5 MG PO TABS: 5.0000 mg | ORAL_TABLET | Freq: Two times a day (BID) | ORAL | 1 refills | Status: DC | PRN

## 2018-08-28 MED FILL — diazePAM 5 MG TABS: 5 | 15 days supply | Qty: 30 | Fill #0

## 2018-09-10 MED FILL — SERTRALINE HCL 100 MG TAB: 100 | 30 days supply | Qty: 30 | Fill #1

## 2018-09-28 NOTE — Progress Notes (Signed)
**Note Ruth-Identified via Obfuscation** Virtual Visit via Video   I connected with Ruth Hollingsheadatherine L Stone on 09/29/18 at 10:40 AM EDT by a video enabled telemedicine application and verified that I am speaking with the correct person using two identifiers. Location patient: Home Location provider: [facility], Office Persons participating in the virtual visit: Ruth Stone, Helane RimaErica Dlugosz, DO Barnie MortJoEllen Thompson, CMA acting as scribe for Dr. Helane RimaErica Coyne.    I discussed the limitations of evaluation and management by telemedicine and the availability of in person appointments. The patient expressed understanding and agreed to proceed.  Subjective:   HPI:  Anxiety Increased anxiety d/t job stress. Taking valium with some relief. Takes Klonopin prn depending on what shift she is working. Taking Zoloft 100 mg. 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 She does Yoga which helps to "calm" her mind during practice. Will send link for virtual visit to help healthcare workers deal with increased stress d/t Coronavirus. Husband working mostly with patients that have tested positive for Covid-19, he has been provided with proper PPE.   Insomnia Taking Zolpidem prn, about twice a month.   B12 deficiency Taking almost daily. Feels more energized, not as fatigued on her days off. Feels more productive.   ROS: See pertinent positives and negatives per HPI.  Patient Active Problem List   Diagnosis Date Noted  . Shifting sleep-work schedule 08/09/2018  . Situational anxiety 08/09/2018  . Vitamin D deficiency 08/09/2018  . B12 deficiency 08/09/2018  . Tinnitus, bilateral 03/14/2017  . Sensorineural hearing loss (SNHL), bilateral 03/14/2017  . Migraine with aura 02/06/2017  . Insomnia 08/12/2015    Social History   Tobacco Use  . Smoking status: Never Smoker  . Smokeless tobacco: Never Used  Substance Use Topics  . Alcohol use: Yes    Alcohol/week: 4.0 - 5.0 standard  drinks    Types: 4 - 5 Standard drinks or equivalent per week   Current Outpatient Medications:  .  Cholecalciferol 1.25 MG (50000 UT) TABS, 50,000 units PO qwk for 12 weeks., Disp: 12 tablet, Rfl: 0 .  clonazePAM (KLONOPIN) 1 MG tablet, Take 1 tablet (1 mg total) by mouth 2 (two) times daily as needed for anxiety., Disp: 60 tablet, Rfl: 4 .  diazepam (VALIUM) 5 MG tablet, Take 1 tablet (5 mg total) by mouth every 12 (twelve) hours as needed for anxiety., Disp: 30 tablet, Rfl: 1 .  drospirenone-ethinyl estradiol (GIANVI) 3-0.02 MG tablet, Take 1 tablet by mouth daily., Disp: 3 Package, Rfl: 4 .  nitrofurantoin (MACRODANTIN) 50 MG capsule, , Disp: , Rfl:  .  sertraline (ZOLOFT) 100 MG tablet, Take 1 tablet (100 mg total) by mouth daily., Disp: 30 tablet, Rfl: 3 .  valACYclovir (VALTREX) 500 MG tablet, , Disp: , Rfl:  .  zolpidem (AMBIEN) 5 MG tablet, , Disp: , Rfl:   No Known Allergies  Objective:   VITALS: Per patient if applicable, see vitals. GENERAL: Alert, appears well and in no acute distress. HEENT: Atraumatic, conjunctiva clear, no obvious abnormalities on inspection of external nose and ears. NECK: Normal movements of the head and neck. CARDIOPULMONARY: No increased WOB. Speaking in clear sentences. I:E ratio WNL.  MS: Moves all visible extremities without noticeable abnormality. PSYCH: Pleasant and cooperative, well-groomed. Speech normal rate and rhythm. Affect is appropriate. Insight and judgement are appropriate. Attention is focused, linear, and appropriate.  NEURO: CN grossly intact. Oriented as arrived to appointment on time with no prompting.  Moves both UE equally.  SKIN: No obvious lesions, wounds, erythema, or cyanosis noted on face or hands.  Assessment and Plan:   Ruth Stone was seen today for follow-up and anxiety.  Diagnoses and all orders for this visit:  Shifting sleep-work schedule  Situational anxiety -     clonazePAM (KLONOPIN) 1 MG tablet; Take 1 tablet  (1 mg total) by mouth 2 (two) times daily as needed for anxiety. -     diazepam (VALIUM) 5 MG tablet; Take 1 tablet (5 mg total) by mouth every 12 (twelve) hours as needed for anxiety. -     sertraline (ZOLOFT) 100 MG tablet; Take 1 tablet (100 mg total) by mouth daily.  B12 deficiency  Primary insomnia -     zolpidem (AMBIEN) 5 MG tablet; Take 1 tablet (5 mg total) by mouth at bedtime. -     clonazePAM (KLONOPIN) 1 MG tablet; Take 1 tablet (1 mg total) by mouth 2 (two) times daily as needed for anxiety.  Well controlled.  No signs of complications, medication side effects, or red flags.  Continue current regimen.    . Reviewed expectations re: course of current medical issues. . Discussed self-management of symptoms. . Outlined signs and symptoms indicating need for more acute intervention. . Patient verbalized understanding and all questions were answered. Marland Kitchen Health Maintenance issues including appropriate healthy diet, exercise, and smoking avoidance were discussed with patient. . See orders for this visit as documented in the electronic medical record.  Helane Rima, DO 09/29/2018

## 2018-09-29 ENCOUNTER — Ambulatory Visit (INDEPENDENT_AMBULATORY_CARE_PROVIDER_SITE_OTHER): Payer: No Typology Code available for payment source | Admitting: Family Medicine

## 2018-09-29 ENCOUNTER — Encounter: Payer: Self-pay | Admitting: Family Medicine

## 2018-09-29 DIAGNOSIS — E538 Deficiency of other specified B group vitamins: Secondary | ICD-10-CM | POA: Diagnosis not present

## 2018-09-29 DIAGNOSIS — G4726 Circadian rhythm sleep disorder, shift work type: Secondary | ICD-10-CM | POA: Diagnosis not present

## 2018-09-29 DIAGNOSIS — F5101 Primary insomnia: Secondary | ICD-10-CM | POA: Diagnosis not present

## 2018-09-29 DIAGNOSIS — F418 Other specified anxiety disorders: Secondary | ICD-10-CM | POA: Diagnosis not present

## 2018-09-29 MED ORDER — DIAZEPAM 5 MG PO TABS: 5.0000 mg | ORAL_TABLET | Freq: Two times a day (BID) | ORAL | 1 refills | Status: DC | PRN

## 2018-09-29 MED ORDER — CLONAZEPAM 1 MG PO TABS: 1.0000 mg | ORAL_TABLET | Freq: Two times a day (BID) | ORAL | 1 refills | Status: DC | PRN

## 2018-09-29 MED ORDER — SERTRALINE HCL 100 MG PO TABS: 100.0000 mg | ORAL_TABLET | Freq: Every day | ORAL | 1 refills | Status: DC

## 2018-09-29 MED ORDER — ZOLPIDEM TARTRATE 5 MG PO TABS: 5.0000 mg | ORAL_TABLET | Freq: Every day | ORAL | 1 refills | Status: DC

## 2018-10-07 ENCOUNTER — Encounter: Payer: Self-pay | Admitting: Family Medicine

## 2018-10-07 MED FILL — ZOLPIDEM TARTRATE 5 MG TAB: 5 | 90 days supply | Qty: 90 | Fill #0

## 2018-10-07 MED FILL — clonazePAM 1 MG TABS: 1 | 45 days supply | Qty: 90 | Fill #0

## 2018-10-07 MED FILL — SERTRALINE HCL 100 MG TAB: 100 | 90 days supply | Qty: 90 | Fill #0

## 2018-10-07 MED FILL — diazePAM 5 MG TABS: 5 | 45 days supply | Qty: 90 | Fill #0

## 2018-10-09 MED ORDER — NITROFURANTOIN MACROCRYSTAL 50 MG PO CAPS: 50.0000 mg | ORAL_CAPSULE | Freq: Every day | ORAL | 3 refills | Status: DC | PRN

## 2018-10-09 MED FILL — NITROFURANTOIN MACROCRYSTAL: 50 | 30 days supply | Qty: 30 | Fill #0

## 2018-10-14 ENCOUNTER — Encounter: Payer: 59 | Admitting: Family Medicine

## 2018-11-10 ENCOUNTER — Ambulatory Visit (INDEPENDENT_AMBULATORY_CARE_PROVIDER_SITE_OTHER): Payer: No Typology Code available for payment source | Admitting: Family Medicine

## 2018-11-10 ENCOUNTER — Encounter: Payer: Self-pay | Admitting: Family Medicine

## 2018-11-10 ENCOUNTER — Other Ambulatory Visit: Payer: Self-pay

## 2018-11-10 VITALS — Ht 68.0 in | Wt 132.0 lb

## 2018-11-10 DIAGNOSIS — T887XXA Unspecified adverse effect of drug or medicament, initial encounter: Secondary | ICD-10-CM

## 2018-11-10 DIAGNOSIS — F418 Other specified anxiety disorders: Secondary | ICD-10-CM | POA: Diagnosis not present

## 2018-11-10 MED ORDER — BUPROPION HCL 75 MG PO TABS: 75.0000 mg | ORAL_TABLET | Freq: Two times a day (BID) | ORAL | 0 refills | Status: DC

## 2018-11-10 MED FILL — buPROPion HCL 75 MG TABS: 75 | 90 days supply | Qty: 180 | Fill #0

## 2018-11-10 NOTE — Progress Notes (Signed)
**Note Ruth-Identified via Obfuscation** Virtual Visit via Video   Due to the COVID-19 pandemic, this visit was completed with telemedicine (audio/video) technology to reduce patient and provider exposure as well as to preserve personal protective equipment.   I connected with Ruth Stone by a video enabled telemedicine application and verified that I am speaking with the correct person using two identifiers. Location patient: Home Location provider: [facility], Office Persons participating in the virtual visit: Ruth Stone, Helane Rima, DO Barnie Mort, CMA acting as scribe for Dr. Helane Rima.   I discussed the limitations of evaluation and management by telemedicine and the availability of in person appointments. The patient expressed understanding and agreed to proceed.  Care Team   Patient Care Team: Helane Rima, DO as PCP - General (Family Medicine)  Subjective:   HPI: Patient was increased to Zoloft 100mg . About two months she started have sexual side effects. Decreased sex drive and inability to climax.  Mood wise she feels great. She did not have side effects at the 50mg  but did have increased anxiety. She would like to keep the  Zoloft and add Wellbutrin. To help with side effects.   Review of Systems  Constitutional: Negative for chills and fever.  HENT: Negative for hearing loss and tinnitus.   Eyes: Negative for blurred vision and double vision.  Respiratory: Negative for cough.   Cardiovascular: Negative for chest pain and palpitations.  Gastrointestinal: Negative for nausea and vomiting.  Genitourinary: Negative for dysuria, frequency and urgency.  Neurological: Negative for dizziness and headaches.  Psychiatric/Behavioral: Negative for depression and suicidal ideas.    Patient Active Problem List   Diagnosis Date Noted  . Shifting sleep-work schedule 08/09/2018  . Situational anxiety 08/09/2018  . Vitamin D deficiency 08/09/2018  . B12 deficiency 08/09/2018  . Tinnitus,  bilateral 03/14/2017  . Sensorineural hearing loss (SNHL), bilateral 03/14/2017  . Migraine with aura 02/06/2017  . Insomnia 08/12/2015    Social History   Tobacco Use  . Smoking status: Never Smoker  . Smokeless tobacco: Never Used  Substance Use Topics  . Alcohol use: Yes    Alcohol/week: 4.0 - 5.0 standard drinks    Types: 4 - 5 Standard drinks or equivalent per week   Current Outpatient Medications:  .  diazepam (VALIUM) 5 MG tablet, Take 1 tablet (5 mg total) by mouth every 12 (twelve) hours as needed for anxiety., Disp: 90 tablet, Rfl: 1 .  drospirenone-ethinyl estradiol (GIANVI) 3-0.02 MG tablet, Take 1 tablet by mouth daily., Disp: 3 Package, Rfl: 4 .  nitrofurantoin (MACRODANTIN) 50 MG capsule, Take 1 capsule (50 mg total) by mouth daily as needed., Disp: 30 capsule, Rfl: 3 .  sertraline (ZOLOFT) 100 MG tablet, Take 1 tablet (100 mg total) by mouth daily., Disp: 90 tablet, Rfl: 1 .  valACYclovir (VALTREX) 500 MG tablet, , Disp: , Rfl:  .  zolpidem (AMBIEN) 5 MG tablet, Take 1 tablet (5 mg total) by mouth at bedtime., Disp: 90 tablet, Rfl: 1  No Known Allergies  Objective:   VITALS: Per patient if applicable, see vitals. GENERAL: Alert, appears well and in no acute distress. HEENT: Atraumatic, conjunctiva clear, no obvious abnormalities on inspection of external nose and ears. NECK: Normal movements of the head and neck. CARDIOPULMONARY: No increased WOB. Speaking in clear sentences. I:E ratio WNL.  MS: Moves all visible extremities without noticeable abnormality. PSYCH: Pleasant and cooperative, well-groomed. Speech normal rate and rhythm. Affect is appropriate. Insight and judgement are appropriate. Attention is focused,  linear, and appropriate.  NEURO: CN grossly intact. Oriented as arrived to appointment on time with no prompting. Moves both UE equally.  SKIN: No obvious lesions, wounds, erythema, or cyanosis noted on face or hands.  [facility]  07/29/2018 08/21/2016  Decreased Interest 1 0  Down, Depressed, Hopeless 2 0  PHQ - 2 Score 3 0  Altered sleeping 0 0  Tired, decreased energy 2 1  Change in appetite 1 0  Feeling bad or failure about yourself  2 0  Trouble concentrating 1 0  Moving slowly or fidgety/restless 0 0  Suicidal thoughts 0 0  PHQ-9 Score 9 1   Assessment and Plan:   Ruth EvansCatherine was seen today for follow-up.  Diagnoses and all orders for this visit:  Situational anxiety Comments: Zoloft has been controlling anxiety well at current dose.   Medication side effect Comments: Will add Wellbutrin to help with motivation and libido. Orders: -     buPROPion (WELLBUTRIN) 75 MG tablet; Take 1 tablet (75 mg total) by mouth 2 (two) times daily.   Marland Kitchen. COVID-19 Education: The signs and symptoms of COVID-19 were discussed with the patient and how to seek care for testing if needed. The importance of social distancing was discussed today. . Reviewed expectations re: course of current medical issues. . Discussed self-management of symptoms. . Outlined signs and symptoms indicating need for more acute intervention. . Patient verbalized understanding and all questions were answered. Marland Kitchen. Health Maintenance issues including appropriate healthy diet, exercise, and smoking avoidance were discussed with patient. . See orders for this visit as documented in the electronic medical record.  Helane RimaErica Nordlund, DO  Records requested if needed. Time spent:15 minutes, of which >50% was spent in obtaining information about her symptoms, reviewing her previous labs, evaluations, and treatments, counseling her about her condition (please see the discussed topics above), and developing a plan to further investigate it; she had a number of questions which I addressed.

## 2018-11-12 ENCOUNTER — Encounter: Payer: Self-pay | Admitting: Family Medicine

## 2018-11-12 ENCOUNTER — Other Ambulatory Visit: Payer: Self-pay

## 2018-11-12 MED ORDER — DROSPIRENONE-ETHINYL ESTRADIOL 3-0.02 MG PO TABS: 1.0000 | ORAL_TABLET | Freq: Every day | ORAL | 4 refills | Status: DC

## 2018-11-12 MED FILL — DROSPIR-ETH ESTRA 3/.02 MG: 3-0.02 | 84 days supply | Qty: 84 | Fill #0

## 2019-03-03 MED FILL — NITROFURANTOIN MACROCRYSTAL: 50 | 30 days supply | Qty: 30 | Fill #1

## 2019-03-04 MED FILL — CEPHALEXIN 500 MG CAPSULE: 500 | 7 days supply | Qty: 28 | Fill #0

## 2019-03-04 MED FILL — DROSPIR-ETH ESTRA 3/.02 MG: 3-0.02 | 84 days supply | Qty: 84 | Fill #1

## 2019-03-05 MED FILL — ZOLPIDEM TARTRATE 5 MG TAB: 5 | 90 days supply | Qty: 90 | Fill #1

## 2019-03-05 MED FILL — diazePAM 5 MG TABS: 5 | 45 days supply | Qty: 90 | Fill #1

## 2019-03-23 ENCOUNTER — Ambulatory Visit: Payer: No Typology Code available for payment source | Admitting: Family Medicine

## 2019-04-07 MED FILL — NITROFURANTOIN MACROCRYSTAL: 50 | 60 days supply | Qty: 60 | Fill #2

## 2019-04-21 MED FILL — NITROFURANTOIN MACROCRYSTAL: 50 | 30 days supply | Qty: 30 | Fill #2

## 2019-04-21 MED FILL — VALACYCLOVIR HCL 500 MG TAB: 500 | 15 days supply | Qty: 30 | Fill #0

## 2019-05-28 ENCOUNTER — Ambulatory Visit: Payer: No Typology Code available for payment source | Attending: Internal Medicine

## 2019-05-28 DIAGNOSIS — U071 COVID-19: Secondary | ICD-10-CM

## 2019-05-28 DIAGNOSIS — R238 Other skin changes: Secondary | ICD-10-CM

## 2019-05-30 LAB — NOVEL CORONAVIRUS, NAA: SARS-CoV-2, NAA: NOT DETECTED

## 2019-06-02 MED FILL — NITROFURANTOIN MACROCRYSTAL: 50 | 30 days supply | Qty: 30 | Fill #3

## 2019-07-02 MED FILL — DOXYCYCLINE HYC 100 MG CAPS: 100 | 10 days supply | Qty: 20 | Fill #0

## 2019-07-27 ENCOUNTER — Other Ambulatory Visit: Payer: Self-pay | Admitting: Family Medicine

## 2019-07-31 ENCOUNTER — Telehealth: Payer: Self-pay | Admitting: Family Medicine

## 2019-08-07 ENCOUNTER — Other Ambulatory Visit: Payer: Self-pay | Admitting: Family Medicine

## 2019-08-07 ENCOUNTER — Other Ambulatory Visit: Payer: Self-pay

## 2019-08-07 DIAGNOSIS — A499 Bacterial infection, unspecified: Secondary | ICD-10-CM

## 2019-08-07 DIAGNOSIS — N390 Urinary tract infection, site not specified: Secondary | ICD-10-CM

## 2019-08-07 MED ORDER — NITROFURANTOIN MACROCRYSTAL 50 MG PO CAPS: 50.0000 mg | ORAL_CAPSULE | Freq: Every day | ORAL | 3 refills | Status: DC | PRN

## 2019-08-07 MED FILL — NITROFURANTOIN MACROCRYSTAL: 50 | 30 days supply | Qty: 30 | Fill #0

## 2019-08-07 NOTE — Telephone Encounter (Signed)
Macrodantin 50 mg cap sent to [facility] outpatient pharmacy- [place], [place]

## 2019-08-07 NOTE — Telephone Encounter (Signed)
See below

## 2019-08-07 NOTE — Telephone Encounter (Signed)
  LAST APPOINTMENT DATE: 07/27/2019   NEXT APPOINTMENT DATE:@3 /24/2021  MEDICATION:nitrofurantoin (MACRODANTIN) 50 MG capsule  PHARMACY:[facility] - [place], [place] - [address]  **Let patient know to contact pharmacy at the end of the day to make sure medication is ready. **  ** Please notify patient to allow 48-72 hours to process**  **Encourage patient to contact the pharmacy for refills or they can request refills through [facility]**  CLINICAL FILLS OUT ALL BELOW:   LAST REFILL:  QTY:  REFILL DATE:    OTHER COMMENTS:    Okay for refill?  Please advise

## 2019-08-07 NOTE — Telephone Encounter (Signed)
Reviewed chart.  Looks like takes as UTI prophylaxis after intercourse. OK to refill. Thanks.

## 2019-08-11 MED FILL — LORYNA 3-0.02 MG TABS: 3-0.02 | 84 days supply | Qty: 84 | Fill #2

## 2019-08-21 ENCOUNTER — Ambulatory Visit: Payer: No Typology Code available for payment source | Attending: Internal Medicine

## 2019-08-21 DIAGNOSIS — Z20822 Contact with and (suspected) exposure to covid-19: Secondary | ICD-10-CM

## 2019-08-22 LAB — NOVEL CORONAVIRUS, NAA: SARS-CoV-2, NAA: NOT DETECTED

## 2019-09-02 ENCOUNTER — Encounter: Payer: No Typology Code available for payment source | Admitting: Family Medicine

## 2019-09-29 MED FILL — DROSPIR-ETH ESTRA 3/.02 MG: 3-0.02 | 84 days supply | Qty: 84 | Fill #2

## 2019-10-26 ENCOUNTER — Encounter: Payer: No Typology Code available for payment source | Admitting: Family Medicine

## 2019-10-28 MED FILL — NITROFURANTOIN MACROCRYSTAL: 50 | 30 days supply | Qty: 30 | Fill #1

## 2019-11-02 ENCOUNTER — Other Ambulatory Visit: Payer: Self-pay

## 2019-11-02 ENCOUNTER — Telehealth: Payer: Self-pay | Admitting: Family Medicine

## 2019-11-02 MED ORDER — VALACYCLOVIR HCL 500 MG PO TABS: 500.0000 mg | ORAL_TABLET | Freq: Every day | ORAL | 0 refills | Status: DC

## 2019-11-02 MED FILL — VALACYCLOVIR HCL 500 MG TAB: 500 | 30 days supply | Qty: 30 | Fill #0

## 2019-11-02 NOTE — Telephone Encounter (Signed)
MEDICATION: valACYclovir (VALTREX) 500 MG tablet  PHARMACY:  [facility] - [place], [place] - [address] Phone:  [phone]  Fax:  [phone]       Comments: Patient is scheduled for a [facility] for July 1st   **Let patient know to contact pharmacy at the end of the day to make sure medication is ready. **  ** Please notify patient to allow 48-72 hours to process**  **Encourage patient to contact the pharmacy for refills or they can request refills through [facility]**

## 2019-11-02 NOTE — Telephone Encounter (Signed)
Sent to pharmacy 

## 2019-11-04 ENCOUNTER — Encounter: Payer: No Typology Code available for payment source | Admitting: Family Medicine

## 2019-11-10 MED FILL — NITROFURANTOIN MACROCRYSTAL: 50 | 30 days supply | Qty: 30 | Fill #1

## 2019-11-25 MED FILL — TRIAMCINOLONE 0.1% PASTE: 0.1 | 7 days supply | Qty: 5 | Fill #0

## 2019-12-10 ENCOUNTER — Other Ambulatory Visit: Payer: Self-pay

## 2019-12-10 ENCOUNTER — Encounter: Payer: Self-pay | Admitting: Family Medicine

## 2019-12-10 ENCOUNTER — Ambulatory Visit (INDEPENDENT_AMBULATORY_CARE_PROVIDER_SITE_OTHER): Payer: No Typology Code available for payment source | Admitting: Family Medicine

## 2019-12-10 VITALS — BP 128/92 | HR 90 | Temp 98.2°F | Ht 68.0 in | Wt 143.8 lb

## 2019-12-10 DIAGNOSIS — Z0000 Encounter for general adult medical examination without abnormal findings: Secondary | ICD-10-CM

## 2019-12-10 DIAGNOSIS — E538 Deficiency of other specified B group vitamins: Secondary | ICD-10-CM

## 2019-12-10 DIAGNOSIS — E559 Vitamin D deficiency, unspecified: Secondary | ICD-10-CM

## 2019-12-10 DIAGNOSIS — Z1159 Encounter for screening for other viral diseases: Secondary | ICD-10-CM | POA: Diagnosis not present

## 2019-12-10 DIAGNOSIS — G4726 Circadian rhythm sleep disorder, shift work type: Secondary | ICD-10-CM

## 2019-12-10 LAB — CBC WITH DIFFERENTIAL/PLATELET
Basophils Absolute: 0.1 10*3/uL (ref 0.0–0.1)
Basophils Relative: 0.6 % (ref 0.0–3.0)
Eosinophils Absolute: 0.1 10*3/uL (ref 0.0–0.7)
Eosinophils Relative: 0.6 % (ref 0.0–5.0)
HCT: 42.1 % (ref 36.0–46.0)
Hemoglobin: 14.2 g/dL (ref 12.0–15.0)
Lymphocytes Relative: 13.2 % (ref 12.0–46.0)
Lymphs Abs: 1.2 10*3/uL (ref 0.7–4.0)
MCHC: 33.7 g/dL (ref 30.0–36.0)
MCV: 91.1 fl (ref 78.0–100.0)
Monocytes Absolute: 0.7 10*3/uL (ref 0.1–1.0)
Monocytes Relative: 8.1 % (ref 3.0–12.0)
Neutro Abs: 6.9 10*3/uL (ref 1.4–7.7)
Neutrophils Relative %: 77.5 % — ABNORMAL HIGH (ref 43.0–77.0)
Platelets: 375 10*3/uL (ref 150.0–400.0)
RBC: 4.62 Mil/uL (ref 3.87–5.11)
RDW: 13 % (ref 11.5–15.5)
WBC: 8.9 10*3/uL (ref 4.0–10.5)

## 2019-12-10 LAB — COMPREHENSIVE METABOLIC PANEL
ALT: 15 U/L (ref 0–35)
AST: 17 U/L (ref 0–37)
Albumin: 4.8 g/dL (ref 3.5–5.2)
Alkaline Phosphatase: 35 U/L — ABNORMAL LOW (ref 39–117)
BUN: 13 mg/dL (ref 6–23)
CO2: 26 mEq/L (ref 19–32)
Calcium: 10.1 mg/dL (ref 8.4–10.5)
Chloride: 100 mEq/L (ref 96–112)
Creatinine, Ser: 0.74 mg/dL (ref 0.40–1.20)
GFR: 91.55 mL/min (ref >60.00)
Glucose, Bld: 91 mg/dL (ref 70–99)
Potassium: 4.4 mEq/L (ref 3.5–5.1)
Sodium: 136 mEq/L (ref 135–145)
Total Bilirubin: 0.5 mg/dL (ref 0.2–1.2)
Total Protein: 7.7 g/dL (ref 6.0–8.3)

## 2019-12-10 LAB — LIPID PANEL
Cholesterol: 185 mg/dL (ref 0–200)
HDL: 64.4 mg/dL (ref >39.00)
LDL Cholesterol: 100 mg/dL — ABNORMAL HIGH (ref 0–99)
NonHDL: 121
Total CHOL/HDL Ratio: 3
Triglycerides: 103 mg/dL (ref 0.0–149.0)
VLDL: 20.6 mg/dL (ref 0.0–40.0)

## 2019-12-10 LAB — TSH: TSH: 2.87 u[IU]/mL (ref 0.35–4.50)

## 2019-12-10 LAB — VITAMIN D 25 HYDROXY (VIT D DEFICIENCY, FRACTURES): VITD: 37.18 ng/mL (ref 30.00–100.00)

## 2019-12-10 LAB — VITAMIN B12: Vitamin B-12: 361 pg/mL (ref 211–911)

## 2019-12-10 NOTE — Progress Notes (Signed)
Patient: Ruth Stone MRN: [id number] DOB: 03-29-1991 PCP: Helane Rima, DO     Subjective:  Chief Complaint  Patient presents with  . Transitions Of Care  . Annual Exam    HPI: The patient is a 29 y.o. female who presents today for annual exam. She denies any changes to past medical history. There have been no recent hospitalizations. They are following a well balanced diet and exercise plan. 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 She eats home cooked meals, and stays away from fried foods.  Weight has been stable. No complaints today. Hx of vitamin D and b12 defiency. Remote history of thyroid issues and was on synthroid, but has been off x 7 years.   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 No refills needed at this time.    her mom has breast cancer. No colon cancer history.   Immunization History  Administered Date(s) Administered  . Influenza-Unspecified 03/12/2015  . Tdap 06/11/2010   Colonoscopy: routine screening  Mammogram: fh of breast cancer in her mother.  Pap smear: utd. Pap in 2019 at [facility]    Review of Systems  Constitutional: Negative for chills, fatigue and fever.  HENT: Negative for dental problem, ear pain, hearing loss and trouble swallowing.   Eyes: Negative for visual disturbance.  Respiratory: Negative for cough, chest tightness and shortness of breath.   Cardiovascular: Negative for chest pain, palpitations and leg swelling.  Gastrointestinal: Negative for abdominal pain, blood in stool, diarrhea, nausea and vomiting.  Endocrine: Negative for cold intolerance, polydipsia, polyphagia and polyuria.  Genitourinary: Negative for dysuria, flank pain, hematuria, pelvic pain and urgency.  Musculoskeletal: Negative for arthralgias.  Skin: Negative for rash.  Neurological: Negative for dizziness, light-headedness and headaches.  Psychiatric/Behavioral: Negative for dysphoric mood and sleep disturbance. The patient is not nervous/anxious.      Allergies Patient has No Known Allergies.  Past Medical History Patient  has a past medical history of History of hypothyroidism.  Surgical History Patient  has a past surgical history that includes Hand surgery (Right, 2010-2011) and Septoplasty (2016).  Family History Pateint's family history includes Cancer in her mother; Heart attack in her maternal grandfather; Hyperlipidemia in her father; Hypertension in her father; Stroke in her paternal grandfather.  Social History Patient  reports that she has never smoked. She has never used smokeless tobacco. She reports current alcohol use of about 4.0 - 5.0 standard drinks of alcohol per week. She reports that she does not use drugs.    Objective: Vitals:   12/10/19 1107 12/10/19 1139  BP: 140/90 (!) 128/92  Pulse: 90   Temp: 98.2 F (36.8 C)   TempSrc: Temporal   SpO2: 99%   Weight: 143 lb 12.8 oz (65.2 kg)   Height: 5\' 8"  (1.727 m)     Body mass index is 21.86 kg/m.  Physical Exam Vitals reviewed.  Constitutional:      Appearance: Normal appearance. She is well-developed and normal weight.  HENT:     Head: Normocephalic and atraumatic.     Right Ear: Tympanic membrane, ear canal and external ear normal.     Left Ear: Tympanic membrane, ear canal and external ear normal.     Mouth/Throat:     Mouth: Mucous membranes are moist.  Eyes:     Conjunctiva/sclera: Conjunctivae normal.     Pupils: Pupils are equal, round, and reactive to light.  Neck:     Thyroid: No thyromegaly.  Cardiovascular:  Rate and Rhythm: Normal rate and regular rhythm.     Heart sounds: Normal heart sounds. No murmur heard.   Pulmonary:     Effort: Pulmonary effort is normal.     Breath sounds: Normal breath sounds.  Abdominal:     General: Abdomen is flat. Bowel sounds are normal. There is no distension.     Palpations: Abdomen is soft.     Tenderness: There is no abdominal tenderness.  Musculoskeletal:     Cervical back: Normal  range of motion and neck supple.  Lymphadenopathy:     Cervical: No cervical adenopathy.  Skin:    General: Skin is warm and dry.     Capillary Refill: Capillary refill takes less than 2 seconds.     Findings: No rash.  Neurological:     General: No focal deficit present.     Mental Status: She is alert and oriented to person, place, and time.     Cranial Nerves: No cranial nerve deficit.     Coordination: Coordination normal.     Deep Tendon Reflexes: Reflexes normal.  Psychiatric:        Mood and Affect: Mood normal.        Behavior: Behavior normal.      Office Visit from 12/10/2019 in [facility]  PHQ-2 Total Score 0         Assessment/plan: 1. Annual physical exam Routine fasting labs today. UTD on her HM. Pap records at [facility]. Healthy and fit. Continue healthy lifestyle. ambien prn for shift work/sleep. No refills needed at this time. F/u in one year or as needed.  - CBC with Differential/Platelet - Comprehensive metabolic panel - Lipid panel - TSH  2. Vitamin D deficiency  - VITAMIN D 25 Hydroxy (Vit-D Deficiency, Fractures)  3. B12 deficiency  - Vitamin B12  4. Encounter for hepatitis C screening test for low risk patient  - Hepatitis C antibody   This visit occurred during the SARS-CoV-2 public health emergency.  Safety protocols were in place, including screening questions prior to the visit, additional usage of staff PPE, and extensive cleaning of exam room while observing appropriate contact time as indicated for disinfecting solutions.     Return in about 1 year (around 12/09/2020) for annual or as needed .     Orland Mustard, MD [facility]  12/10/2019

## 2019-12-11 LAB — HEPATITIS C ANTIBODY
Hepatitis C Ab: NONREACTIVE
SIGNAL TO CUT-OFF: 0 (ref <1.00)

## 2020-01-27 ENCOUNTER — Other Ambulatory Visit: Payer: Self-pay | Admitting: Family Medicine

## 2020-01-27 MED ORDER — DROSPIRENONE-ETHINYL ESTRADIOL 3-0.02 MG PO TABS: 1.0000 | ORAL_TABLET | Freq: Every day | ORAL | 3 refills | Status: DC

## 2020-01-27 MED FILL — DROSPIRENONE-EE 3-0.02 MG T: 3-0.02 | 84 days supply | Qty: 84 | Fill #0

## 2020-01-27 NOTE — Telephone Encounter (Signed)
MEDICATION:  drospirenone-ethinyl estradiol (GIANVI) 3-0.02 MG tablet    PHARMACY:  [facility] - [place], [place] - [address] Phone:  [phone]  Fax:  [phone]       Comments:   **Let patient know to contact pharmacy at the end of the day to make sure medication is ready. **  ** Please notify patient to allow 48-72 hours to process**  **Encourage patient to contact the pharmacy for refills or they can request refills through [facility]**

## 2020-01-27 NOTE — Telephone Encounter (Signed)
Refilled.  Searcy Miyoshi, MD [facility]   

## 2020-03-29 MED FILL — NITROFURANTOIN MACROCRYSTAL: 50 | 30 days supply | Qty: 30 | Fill #2

## 2020-04-08 ENCOUNTER — Other Ambulatory Visit: Payer: Self-pay | Admitting: Family Medicine

## 2020-04-08 MED ORDER — DROSPIRENONE-ETHINYL ESTRADIOL 3-0.02 MG PO TABS: 1.0000 | ORAL_TABLET | Freq: Every day | ORAL | 3 refills | Status: DC

## 2020-05-02 ENCOUNTER — Telehealth: Payer: Self-pay

## 2020-05-02 NOTE — Telephone Encounter (Signed)
MEDICATION:  zolpidem (AMBIEN) 5 MG tablet diazepam (VALIUM) 5 MG tablet  PHARMACY: [facility] - [place], [place] - [address] Phone:  [phone]  Fax:  [phone]       Comments:   **Let patient know to contact pharmacy at the end of the day to make sure medication is ready. **  ** Please notify patient to allow 48-72 hours to process**  **Encourage patient to contact the pharmacy for refills or they can request refills through [facility]**

## 2020-05-03 ENCOUNTER — Other Ambulatory Visit: Payer: Self-pay

## 2020-05-03 ENCOUNTER — Other Ambulatory Visit (HOSPITAL_COMMUNITY): Payer: Self-pay | Admitting: Family Medicine

## 2020-05-03 DIAGNOSIS — F5101 Primary insomnia: Secondary | ICD-10-CM

## 2020-05-03 DIAGNOSIS — F418 Other specified anxiety disorders: Secondary | ICD-10-CM

## 2020-05-03 MED ORDER — ZOLPIDEM TARTRATE 5 MG PO TABS: 5.0000 mg | ORAL_TABLET | Freq: Every day | ORAL | 1 refills | Status: DC

## 2020-05-03 MED ORDER — DIAZEPAM 5 MG PO TABS: 5.0000 mg | ORAL_TABLET | Freq: Two times a day (BID) | ORAL | 1 refills | Status: DC | PRN

## 2020-05-03 MED FILL — MUPIROCIN 2% OINTMENT: 2 | 7 days supply | Qty: 22 | Fill #0

## 2020-05-03 MED FILL — CIPROFLOXACIN HCL 500 MG TA: 500 | 7 days supply | Qty: 14 | Fill #0

## 2020-05-03 MED FILL — CIPROFLOXACIN HCL 500 MG TA: 500 | 7 days supply | Qty: 14 | Fill #0 | Status: TO

## 2020-05-03 MED FILL — MUPIROCIN 2% OINTMENT: 2 | 7 days supply | Qty: 22 | Fill #0 | Status: TO

## 2020-05-03 NOTE — Telephone Encounter (Signed)
Last refill: both 09/29/18 #90, 1 Last OV: 12/10/19 dx. TOC

## 2020-05-03 NOTE — Telephone Encounter (Signed)
Script request sent to PCP and Dr. Mardelle Matte

## 2020-05-31 MED FILL — diazePAM 5 MG TABS: 5 | 45 days supply | Qty: 90 | Fill #0

## 2020-05-31 MED FILL — ZOLPIDEM TARTRATE 5 MG TAB: 5 | 90 days supply | Qty: 90 | Fill #0

## 2020-06-13 ENCOUNTER — Other Ambulatory Visit (HOSPITAL_BASED_OUTPATIENT_CLINIC_OR_DEPARTMENT_OTHER): Payer: Self-pay | Admitting: Family Medicine

## 2020-06-13 MED FILL — FLUCONAZOLE 150 MG TABS: 150 | 3 days supply | Qty: 2 | Fill #0

## 2020-06-13 MED FILL — NITROFURANTOIN MACROCRYSTAL: 50 | 30 days supply | Qty: 30 | Fill #3

## 2020-09-29 MED FILL — Diazepam Tab 5 MG: ORAL | 45 days supply | Qty: 90 | Fill #0 | Status: AC

## 2020-09-29 MED FILL — Zolpidem Tartrate Tab 5 MG: ORAL | 90 days supply | Qty: 90 | Fill #0 | Status: AC

## 2020-09-30 ENCOUNTER — Other Ambulatory Visit (HOSPITAL_COMMUNITY): Payer: Self-pay

## 2020-10-19 ENCOUNTER — Ambulatory Visit (INDEPENDENT_AMBULATORY_CARE_PROVIDER_SITE_OTHER): Payer: No Typology Code available for payment source | Admitting: Family Medicine

## 2020-10-19 ENCOUNTER — Encounter: Payer: Self-pay | Admitting: Family Medicine

## 2020-10-19 ENCOUNTER — Other Ambulatory Visit: Payer: Self-pay

## 2020-10-19 ENCOUNTER — Other Ambulatory Visit (HOSPITAL_BASED_OUTPATIENT_CLINIC_OR_DEPARTMENT_OTHER): Payer: Self-pay

## 2020-10-19 VITALS — BP 140/92 | HR 83 | Temp 98.6°F | Ht 68.0 in | Wt 133.6 lb

## 2020-10-19 DIAGNOSIS — F5101 Primary insomnia: Secondary | ICD-10-CM | POA: Diagnosis not present

## 2020-10-19 DIAGNOSIS — L659 Nonscarring hair loss, unspecified: Secondary | ICD-10-CM

## 2020-10-19 DIAGNOSIS — R030 Elevated blood-pressure reading, without diagnosis of hypertension: Secondary | ICD-10-CM

## 2020-10-19 LAB — CBC WITH DIFFERENTIAL/PLATELET
Basophils Absolute: 0.1 10*3/uL (ref 0.0–0.1)
Basophils Relative: 0.5 % (ref 0.0–3.0)
Eosinophils Absolute: 0 10*3/uL (ref 0.0–0.7)
Eosinophils Relative: 0.2 % (ref 0.0–5.0)
HCT: 40.4 % (ref 36.0–46.0)
Hemoglobin: 13.6 g/dL (ref 12.0–15.0)
Lymphocytes Relative: 15.4 % (ref 12.0–46.0)
Lymphs Abs: 1.5 10*3/uL (ref 0.7–4.0)
MCHC: 33.7 g/dL (ref 30.0–36.0)
MCV: 91.1 fl (ref 78.0–100.0)
Monocytes Absolute: 0.6 10*3/uL (ref 0.1–1.0)
Monocytes Relative: 6.5 % (ref 3.0–12.0)
Neutro Abs: 7.5 10*3/uL (ref 1.4–7.7)
Neutrophils Relative %: 77.4 % — ABNORMAL HIGH (ref 43.0–77.0)
Platelets: 349 10*3/uL (ref 150.0–400.0)
RBC: 4.44 Mil/uL (ref 3.87–5.11)
RDW: 13 % (ref 11.5–15.5)
WBC: 9.7 10*3/uL (ref 4.0–10.5)

## 2020-10-19 LAB — TSH: TSH: 4.23 u[IU]/mL (ref 0.35–4.50)

## 2020-10-19 LAB — COMPREHENSIVE METABOLIC PANEL
ALT: 13 U/L (ref 0–35)
AST: 15 U/L (ref 0–37)
Albumin: 4.8 g/dL (ref 3.5–5.2)
Alkaline Phosphatase: 40 U/L (ref 39–117)
BUN: 19 mg/dL (ref 6–23)
CO2: 26 mEq/L (ref 19–32)
Calcium: 9.7 mg/dL (ref 8.4–10.5)
Chloride: 100 mEq/L (ref 96–112)
Creatinine, Ser: 0.73 mg/dL (ref 0.40–1.20)
GFR: 109.26 mL/min (ref >60.00)
Glucose, Bld: 77 mg/dL (ref 70–99)
Potassium: 4.1 mEq/L (ref 3.5–5.1)
Sodium: 137 mEq/L (ref 135–145)
Total Bilirubin: 0.6 mg/dL (ref 0.2–1.2)
Total Protein: 7.7 g/dL (ref 6.0–8.3)

## 2020-10-19 LAB — VITAMIN B12: Vitamin B-12: 510 pg/mL (ref 211–911)

## 2020-10-19 MED ORDER — NITROFURANTOIN MONOHYD MACRO 100 MG PO CAPS
ORAL_CAPSULE | ORAL | 1 refills | Status: AC
  Filled 2020-10-19: qty 30, 30d supply, fill #0

## 2020-10-19 MED ORDER — FLUCONAZOLE 150 MG PO TABS
ORAL_TABLET | ORAL | 1 refills | Status: AC
  Filled 2020-10-19: qty 2, 3d supply, fill #0

## 2020-10-19 MED ORDER — DROSPIRENONE-ETHINYL ESTRADIOL 3-0.02 MG PO TABS
1.0000 | ORAL_TABLET | Freq: Every day | ORAL | 3 refills | Status: AC
  Filled 2020-10-19: qty 84, 84d supply, fill #0

## 2020-10-19 MED ORDER — ZOLPIDEM TARTRATE 5 MG PO TABS
ORAL_TABLET | Freq: Every day | ORAL | 1 refills | Status: AC
  Filled 2020-10-19: qty 90, fill #0
  Filled 2020-11-03 – 2020-11-28 (×2): qty 90, 90d supply, fill #0

## 2020-10-19 MED ORDER — VALACYCLOVIR HCL 1 G PO TABS
1000.0000 mg | ORAL_TABLET | Freq: Every day | ORAL | 1 refills | Status: AC
  Filled 2020-10-19: qty 30, 30d supply, fill #0

## 2020-10-19 NOTE — Progress Notes (Signed)
Patient: Ruth Stone MRN: [id number] DOB: May 12, 1991 PCP: Orland Mustard, MD     Subjective:  Chief Complaint  Patient presents with  . Hair Loss    She noticed about 6 months ago after stopping BC pills.   . Medication Refill    HPI: The patient is a 30 y.o. female who presents today for hair loss. She thinks it may have something to do with stopping her BC pills. She would like to have blood work done.   She also is needing refills of medication as she is needing this since moving due to a new job.   Hair loss She has had a lot of hair thinning and wonders if due to her stopping her OCP. She does have family hx of female pattern hair loss. Has not had covid. Has had covid vaccines. She noticed it about 6 months ago. She stopped the opc about 8 months ago. She states it is more diffuse thinning. It's around temple area/crown area. She has not really  noticed more hair loss in brush or shower.    Her bp is normal at home, elevated today.   Review of Systems  Constitutional: Negative for chills, fatigue and fever.  HENT: Negative for dental problem, ear pain, hearing loss and trouble swallowing.   Eyes: Negative for visual disturbance.  Respiratory: Negative for cough, chest tightness and shortness of breath.   Cardiovascular: Negative for chest pain, palpitations and leg swelling.  Gastrointestinal: Negative for abdominal pain, blood in stool, diarrhea and nausea.  Endocrine: Negative for cold intolerance, polydipsia, polyphagia and polyuria.  Genitourinary: Negative for dysuria and hematuria.  Musculoskeletal: Negative for arthralgias.  Skin: Negative for rash.       Hair thinning   Neurological: Negative for dizziness and headaches.  Psychiatric/Behavioral: Negative for dysphoric mood and sleep disturbance. The patient is not nervous/anxious.     Allergies Patient has No Known Allergies.  Past Medical History Patient  has a past medical history of History of  hypothyroidism.  Surgical History Patient  has a past surgical history that includes Hand surgery (Right, 2010-2011) and Septoplasty (2016).  Family History Pateint's family history includes Cancer in her mother; Heart attack in her maternal grandfather; Hyperlipidemia in her father; Hypertension in her father; Stroke in her paternal grandfather.  Social History Patient  reports that she has never smoked. She has never used smokeless tobacco. She reports current alcohol use of about 4.0 - 5.0 standard drinks of alcohol per week. She reports that she does not use drugs.    Objective: Vitals:   10/19/20 1113  BP: (!) 140/92  Pulse: 83  Temp: 98.6 F (37 C)  TempSrc: Temporal  SpO2: 99%  Weight: 133 lb 9.6 oz (60.6 kg)  Height: 5\' 8"  (1.727 m)    Body mass index is 20.31 kg/m.  Physical Exam Vitals reviewed.  Constitutional:      Appearance: Normal appearance. She is normal weight.  HENT:     Head: Normocephalic and atraumatic.  Pulmonary:     Effort: Pulmonary effort is normal.  Abdominal:     General: Abdomen is flat.  Skin:    Comments: Hair thinning on crown, bilateral temple areas. Hair pull test negative.   Neurological:     General: No focal deficit present.     Mental Status: She is alert and oriented to person, place, and time.  Psychiatric:        Mood and Affect: Mood normal.  Behavior: Behavior normal.        Assessment/plan: 1. Hair loss Has family history vs. ocp vs. Other issues. Checking labs and then will go from there. No derm referral today as she is starting new job. If lab work up is negative would recommend topical minoxidil and possible anti-androgen. Did discuss possible pregnancy, but that is not in the near future for her. She is on ocp.  - CBC with Differential/Platelet - Comprehensive metabolic panel - TSH - Vitamin B12 - ANA, IFA Comprehensive Panel  2. Primary insomnia Refilled ambien. Uses prn.  - zolpidem (AMBIEN) 5 MG  tablet; TAKE 1 TABLET (5 MG TOTAL) BY MOUTH AT BEDTIME.  Dispense: 90 tablet; Refill: 1  3. Elevated blood pressure -typically well controlled. 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 Knows parameters/guidelines and will fu with new pcp after her move.   Refilled ocp. UTD on her pap smear, last done 02/2018. Will establish care with new pcp/gyn after her move.     Return if symptoms worsen or fail to improve.   Orland Mustard, MD [facility]   10/19/2020

## 2020-10-19 NOTE — Patient Instructions (Signed)
-  lots of labs today. -do not think you have hydradenitis. Marland Kitchen)  -refilled medication.   Best of luck! So excited for you!

## 2020-10-21 LAB — ANA, IFA COMPREHENSIVE PANEL
Anti Nuclear Antibody (ANA): NEGATIVE
ENA SM Ab Ser-aCnc: <1 AI
SM/RNP: <1 AI
SSA (Ro) (ENA) Antibody, IgG: <1 AI
SSB (La) (ENA) Antibody, IgG: <1 AI
Scleroderma (Scl-70) (ENA) Antibody, IgG: <1 AI
ds DNA Ab: 3 IU/mL

## 2020-10-25 ENCOUNTER — Encounter: Payer: Self-pay | Admitting: Family Medicine

## 2020-10-27 ENCOUNTER — Other Ambulatory Visit (HOSPITAL_COMMUNITY): Payer: Self-pay

## 2020-10-27 MED ORDER — SPIRONOLACTONE 100 MG PO TABS
100.0000 mg | ORAL_TABLET | Freq: Every day | ORAL | 1 refills | Status: AC
  Filled 2020-10-27 – 2020-10-28 (×2): qty 90, 90d supply, fill #0

## 2020-10-28 ENCOUNTER — Other Ambulatory Visit (HOSPITAL_BASED_OUTPATIENT_CLINIC_OR_DEPARTMENT_OTHER): Payer: Self-pay

## 2020-10-28 ENCOUNTER — Other Ambulatory Visit (HOSPITAL_COMMUNITY): Payer: Self-pay

## 2020-10-28 ENCOUNTER — Encounter: Payer: Self-pay | Admitting: Family Medicine

## 2020-11-03 ENCOUNTER — Other Ambulatory Visit (HOSPITAL_BASED_OUTPATIENT_CLINIC_OR_DEPARTMENT_OTHER): Payer: Self-pay

## 2020-11-28 ENCOUNTER — Other Ambulatory Visit (HOSPITAL_BASED_OUTPATIENT_CLINIC_OR_DEPARTMENT_OTHER): Payer: Self-pay

## 2020-12-15 ENCOUNTER — Encounter: Payer: No Typology Code available for payment source | Admitting: Family Medicine

## 2021-03-29 ENCOUNTER — Other Ambulatory Visit (HOSPITAL_BASED_OUTPATIENT_CLINIC_OR_DEPARTMENT_OTHER): Payer: Self-pay

## 2021-03-29 ENCOUNTER — Other Ambulatory Visit (HOSPITAL_COMMUNITY): Payer: Self-pay

## 2021-04-12 ENCOUNTER — Other Ambulatory Visit (HOSPITAL_COMMUNITY): Payer: Self-pay

## 2021-04-12 MED ORDER — TRETINOIN 0.025 % EX CREA
TOPICAL_CREAM | Freq: Every evening | CUTANEOUS | 0 refills | Status: AC
  Filled 2021-04-12 – 2021-05-30 (×2): qty 45, 30d supply, fill #0

## 2021-05-30 ENCOUNTER — Other Ambulatory Visit (HOSPITAL_COMMUNITY): Payer: Self-pay

## 2021-06-07 ENCOUNTER — Other Ambulatory Visit (HOSPITAL_COMMUNITY): Payer: Self-pay

## 2023-12-11 ENCOUNTER — Other Ambulatory Visit (HOSPITAL_BASED_OUTPATIENT_CLINIC_OR_DEPARTMENT_OTHER): Payer: Self-pay
